# Patient Record
Sex: Female | Born: 1955 | Race: Black or African American | Hispanic: No | Smoking: Never smoker
Health system: Southern US, Community
[De-identification: ages and names within clinical notes are randomized; demographics above are authoritative.]

## PROBLEM LIST (undated history)

## (undated) DIAGNOSIS — IMO0002 Reserved for concepts with insufficient information to code with codable children: Secondary | ICD-10-CM

## (undated) DIAGNOSIS — I10 Essential (primary) hypertension: Secondary | ICD-10-CM

## (undated) DIAGNOSIS — E1165 Type 2 diabetes mellitus with hyperglycemia: Secondary | ICD-10-CM

## (undated) HISTORY — PX: OTHER SURGICAL HISTORY: SHX169

## (undated) HISTORY — PX: CHOLECYSTECTOMY: SHX55

## (undated) HISTORY — DX: Reserved for concepts with insufficient information to code with codable children: IMO0002

## (undated) HISTORY — DX: Essential (primary) hypertension: I10

## (undated) HISTORY — DX: Type 2 diabetes mellitus with hyperglycemia: E11.65

---

## 2013-10-27 HISTORY — PX: COLONOSCOPY: SHX174

## 2013-12-19 ENCOUNTER — Other Ambulatory Visit: Payer: Self-pay | Admitting: Internal Medicine

## 2013-12-19 DIAGNOSIS — Z0000 Encounter for general adult medical examination without abnormal findings: Secondary | ICD-10-CM

## 2014-05-01 ENCOUNTER — Ambulatory Visit (INDEPENDENT_AMBULATORY_CARE_PROVIDER_SITE_OTHER): Payer: BC Managed Care – PPO | Admitting: Podiatry

## 2014-05-01 ENCOUNTER — Encounter: Payer: Self-pay | Admitting: Podiatry

## 2014-05-01 VITALS — BP 142/89 | HR 92 | Ht 65.0 in | Wt 178.0 lb

## 2014-05-01 DIAGNOSIS — M79609 Pain in unspecified limb: Secondary | ICD-10-CM

## 2014-05-01 DIAGNOSIS — M216X9 Other acquired deformities of unspecified foot: Secondary | ICD-10-CM

## 2014-05-01 NOTE — Progress Notes (Signed)
Subjective: 58 year old female presents for Diabetic foot check. Blood sugar is under control, was in 90's this morning. No foot pain. Toe nails give her no problems. Does walk (7 miles/day) and lift weight. Walks 30 minutes every 2 hours. Having been doing this for the past 2 years. Gets her nails and hands done q 3 weeks.   Objective: Dermatologic: No abnormal findings other than thick nails. Vascular: All pedal pulses are palpable.  Neurologic: All epicritic and tactile sensations grossly intact.  Orthopedic: Tight Achilles tendon bilateral. Mild bunion bilateral.   Assessment: Diabetic under control. Ankle equinus bilateral.  Plan: Reviewed stretch exercise for tight Achilles tendon bilateral.

## 2014-05-01 NOTE — Patient Instructions (Signed)
Seen for diabetic foot check.  Noted of tight Achilles tendon bilateral. May benefit from stretch exercise.

## 2015-05-04 ENCOUNTER — Ambulatory Visit: Payer: BC Managed Care – PPO | Admitting: Podiatry

## 2015-05-23 ENCOUNTER — Ambulatory Visit (INDEPENDENT_AMBULATORY_CARE_PROVIDER_SITE_OTHER): Payer: Managed Care, Other (non HMO) | Admitting: Podiatry

## 2015-05-23 ENCOUNTER — Encounter: Payer: Self-pay | Admitting: Podiatry

## 2015-05-23 VITALS — BP 166/99 | HR 85

## 2015-05-23 DIAGNOSIS — M216X2 Other acquired deformities of left foot: Secondary | ICD-10-CM

## 2015-05-23 DIAGNOSIS — M216X1 Other acquired deformities of right foot: Secondary | ICD-10-CM

## 2015-05-23 NOTE — Patient Instructions (Signed)
Annual exam. Doing well with exercise. Continue with Achilles tendon stretch exercise.

## 2015-05-23 NOTE — Progress Notes (Signed)
Subjective: 59 year old female presents for Diabetic foot check.  Blood sugar is under control, was in 90's this morning. Continues with her daily walk (3 miles/day) and does eliptical.   Objective: Dermatologic: No abnormal findings other than thick nails. Vascular: All pedal pulses are palpable.  Neurologic: All epicritic and tactile sensations grossly intact.  Orthopedic: Tight Achilles tendon bilateral. Mild bunion bilateral.   Assessment: Diabetic under control. Ankle equinus bilateral.  Plan: Noted of no new change since last visit.  Reviewed stretch exercise for tight Achilles tendon bilateral.

## 2016-05-21 ENCOUNTER — Ambulatory Visit: Payer: Managed Care, Other (non HMO) | Admitting: Podiatry

## 2020-02-17 ENCOUNTER — Encounter: Payer: Self-pay | Admitting: Family Medicine

## 2020-02-17 ENCOUNTER — Ambulatory Visit (INDEPENDENT_AMBULATORY_CARE_PROVIDER_SITE_OTHER): Payer: PRIVATE HEALTH INSURANCE | Admitting: Family Medicine

## 2020-02-17 ENCOUNTER — Other Ambulatory Visit: Payer: Self-pay

## 2020-02-17 VITALS — BP 220/120 | HR 84 | Temp 97.8°F | Resp 18 | Ht 65.0 in | Wt 167.4 lb

## 2020-02-17 DIAGNOSIS — E1165 Type 2 diabetes mellitus with hyperglycemia: Secondary | ICD-10-CM | POA: Insufficient documentation

## 2020-02-17 DIAGNOSIS — I10 Essential (primary) hypertension: Secondary | ICD-10-CM | POA: Diagnosis not present

## 2020-02-17 LAB — TSH: TSH: 2.31 u[IU]/mL (ref 0.35–4.50)

## 2020-02-17 LAB — CBC WITH DIFFERENTIAL/PLATELET
Basophils Absolute: 0.1 10*3/uL (ref 0.0–0.1)
Basophils Relative: 0.7 % (ref 0.0–3.0)
Eosinophils Absolute: 0.2 10*3/uL (ref 0.0–0.7)
Eosinophils Relative: 2 % (ref 0.0–5.0)
HCT: 40.3 % (ref 36.0–46.0)
Hemoglobin: 13.6 g/dL (ref 12.0–15.0)
Lymphocytes Relative: 22 % (ref 12.0–46.0)
Lymphs Abs: 1.8 10*3/uL (ref 0.7–4.0)
MCHC: 33.7 g/dL (ref 30.0–36.0)
MCV: 95.4 fl (ref 78.0–100.0)
Monocytes Absolute: 0.4 10*3/uL (ref 0.1–1.0)
Monocytes Relative: 4.5 % (ref 3.0–12.0)
Neutro Abs: 5.7 10*3/uL (ref 1.4–7.7)
Neutrophils Relative %: 70.8 % (ref 43.0–77.0)
Platelets: 261 10*3/uL (ref 150.0–400.0)
RBC: 4.22 Mil/uL (ref 3.87–5.11)
RDW: 12.6 % (ref 11.5–15.5)
WBC: 8 10*3/uL (ref 4.0–10.5)

## 2020-02-17 LAB — COMPREHENSIVE METABOLIC PANEL
ALT: 21 U/L (ref 0–35)
AST: 18 U/L (ref 0–37)
Albumin: 4.4 g/dL (ref 3.5–5.2)
Alkaline Phosphatase: 104 U/L (ref 39–117)
BUN: 12 mg/dL (ref 6–23)
CO2: 29 mEq/L (ref 19–32)
Calcium: 10.6 mg/dL — ABNORMAL HIGH (ref 8.4–10.5)
Chloride: 97 mEq/L (ref 96–112)
Creatinine, Ser: 0.83 mg/dL (ref 0.40–1.20)
GFR: 83.95 mL/min (ref >60.00)
Glucose, Bld: 377 mg/dL — ABNORMAL HIGH (ref 70–99)
Potassium: 3.8 mEq/L (ref 3.5–5.1)
Sodium: 133 mEq/L — ABNORMAL LOW (ref 135–145)
Total Bilirubin: 0.9 mg/dL (ref 0.2–1.2)
Total Protein: 7.5 g/dL (ref 6.0–8.3)

## 2020-02-17 LAB — LIPID PANEL
Cholesterol: 199 mg/dL (ref 0–200)
HDL: 46.2 mg/dL (ref >39.00)
NonHDL: 152.74
Total CHOL/HDL Ratio: 4
Triglycerides: 212 mg/dL — ABNORMAL HIGH (ref 0.0–149.0)
VLDL: 42.4 mg/dL — ABNORMAL HIGH (ref 0.0–40.0)

## 2020-02-17 LAB — MICROALBUMIN / CREATININE URINE RATIO
Creatinine,U: 31.3 mg/dL
Microalb Creat Ratio: 66.1 mg/g — ABNORMAL HIGH (ref 0.0–30.0)
Microalb, Ur: 20.7 mg/dL — ABNORMAL HIGH (ref 0.0–1.9)

## 2020-02-17 LAB — HEMOGLOBIN A1C: Hgb A1c MFr Bld: 13.5 % — ABNORMAL HIGH (ref 4.6–6.5)

## 2020-02-17 LAB — LDL CHOLESTEROL, DIRECT: Direct LDL: 91 mg/dL

## 2020-02-17 MED ORDER — AMLODIPINE BESYLATE 10 MG PO TABS: 10.0000 mg | ORAL_TABLET | Freq: Every day | ORAL | 1 refills | Status: DC

## 2020-02-17 MED ORDER — LOSARTAN POTASSIUM-HCTZ 100-25 MG PO TABS: 1.0000 | ORAL_TABLET | Freq: Every day | ORAL | 1 refills | Status: DC

## 2020-02-17 NOTE — Assessment & Plan Note (Signed)
Check labs  con't meds  Refer to nutrition

## 2020-02-17 NOTE — Patient Instructions (Addendum)
Type 2 Diabetes Mellitus, Diagnosis, Adult Type 2 diabetes (type 2 diabetes mellitus) is a long-term (chronic) disease. It may be caused by one or both of these problems:  Your pancreas does not make enough of a hormone called insulin.  Your body does not react in a normal way to insulin that it makes. Insulin lets sugars (glucose) go into cells in your body. This gives you energy. If you have type 2 diabetes, sugars cannot get into cells. This causes high blood sugar (hyperglycemia). Your doctor will set treatment goals for you. Generally, you should have these blood sugar levels:  Before meals (preprandial): 80-130 mg/dL (4.4-7.2 mmol/L).  After meals (postprandial): below 180 mg/dL (10 mmol/L).  A1c (hemoglobin A1c) level: less than 7%. Follow these instructions at home: Questions to ask your doctor  You may want to ask these questions: ? Do I need to meet with a diabetes educator? ? Where can I find a support group for people with diabetes? ? What equipment will I need to care for myself at home? ? What diabetes medicines do I need? When should I take them? ? How often do I need to check my blood sugar? ? What number can I call if I have questions? ? When is my next doctor's visit? General instructions  Take over-the-counter and prescription medicines only as told by your doctor.  Keep all follow-up visits as told by your doctor. This is important. Contact a doctor if:  Your blood sugar is at or above 240 mg/dL (13.3 mmol/L) for 2 days in a row.  You have been sick for 2 days or more, and you are not getting better.  You have had a fever for 2 days or more, and you are not getting better.  You have any of these problems for more than 6 hours: ? You cannot eat or drink. ? You feel sick to your stomach (nauseous). ? You throw up (vomit). ? You have watery poop (diarrhea). Get help right away if:  Your blood sugar is lower than 54 mg/dL (3 mmol/L).  You get  confused.  You have trouble: ? Thinking clearly. ? Breathing.  You have moderate or large ketone levels in your pee (urine). Summary  Type 2 diabetes is a long-term (chronic) disease. Your pancreas may not make enough of a hormone called insulin, or your body may not react normally to insulin that it makes.  Take over-the-counter and prescription medicines only as told by your doctor.  Keep all follow-up visits as told by your doctor. This is important. This information is not intended to replace advice given to you by your health care provider. Make sure you discuss any questions you have with your health care provider. Document Revised: 11/13/2017 Document Reviewed: 10/19/2015 Elsevier Patient Education  2020 Elsevier Inc.  

## 2020-02-17 NOTE — Progress Notes (Signed)
Patient ID: Jordan Joseph, female    DOB: February 28, 1956  Age: 64 y.o. MRN: [id number]    Subjective:  Subjective  HPI Jordan Joseph presents to establish and f/u dm, bp    Pt has been out of her bp meds for almost 2 weeks and has complained of blurry vision since  No cp, no headache, no palpitations   Review of Systems  Constitutional: Negative for appetite change, diaphoresis, fatigue and unexpected weight change.  Eyes: Positive for visual disturbance. Negative for pain and redness.  Respiratory: Negative for cough, chest tightness, shortness of breath and wheezing.   Cardiovascular: Negative for chest pain, palpitations and leg swelling.  Endocrine: Negative for cold intolerance, heat intolerance, polydipsia, polyphagia and polyuria.  Genitourinary: Negative for difficulty urinating, dysuria and frequency.  Neurological: Negative for dizziness, light-headedness, numbness and headaches.    History Past Medical History:  Diagnosis Date  . Diabetes mellitus type II, uncontrolled (HCC)   . HTN (hypertension)     She has no past surgical history on file.   Her family history includes Alzheimer's disease in her mother; Cirrhosis in her father; Diabetes in her brother; Diabetes Mellitus II in her father; Hypertension in her brother and father.She reports that she has never smoked. She has never used smokeless tobacco. No history on file for alcohol and drug.  Current Outpatient Medications on File Prior to Visit  Medication Sig Dispense Refill  . glipiZIDE-metformin (METAGLIP) 5-500 MG per tablet Take 1 tablet by mouth 2 (two) times daily before a meal.      No current facility-administered medications on file prior to visit.     Objective:  Objective  Physical Exam Vitals and nursing note reviewed.  Constitutional:      Appearance: She is well-developed.  HENT:     Head: Normocephalic and atraumatic.  Eyes:     Conjunctiva/sclera: Conjunctivae normal.  Neck:     Thyroid: No  thyromegaly.     Vascular: No carotid bruit or JVD.  Cardiovascular:     Rate and Rhythm: Normal rate and regular rhythm.     Heart sounds: Normal heart sounds. No murmur.  Pulmonary:     Effort: Pulmonary effort is normal. No respiratory distress.     Breath sounds: Normal breath sounds. No wheezing or rales.  Chest:     Chest wall: No tenderness.  Musculoskeletal:     Cervical back: Normal range of motion and neck supple.  Neurological:     Mental Status: She is alert and oriented to person, place, and time.    BP (!) 220/120 (BP Location: Right Arm, Patient Position: Sitting, Cuff Size: Large)   Pulse 84   Temp 97.8 F (36.6 C) (Temporal)   Resp 18   Ht 5\' 5"  (1.651 m)   Wt 167 lb 6.4 oz (75.9 kg)   LMP  (LMP Unknown)   SpO2 97%   BMI 27.86 kg/m  Wt Readings from Last 3 Encounters:  02/17/20 167 lb 6.4 oz (75.9 kg)  05/01/14 178 lb (80.7 kg)     Lab Results  Component Value Date   WBC 8.0 02/17/2020   HGB 13.6 02/17/2020   HCT 40.3 02/17/2020   PLT 261.0 02/17/2020   GLUCOSE 377 (H) 02/17/2020   CHOL 199 02/17/2020   TRIG 212.0 (H) 02/17/2020   HDL 46.20 02/17/2020   LDLDIRECT 91.0 02/17/2020   ALT 21 02/17/2020   AST 18 02/17/2020   NA 133 (L) 02/17/2020   K 3.8 02/17/2020  CL 97 02/17/2020   CREATININE 0.83 02/17/2020   BUN 12 02/17/2020   CO2 29 02/17/2020   TSH 2.31 02/17/2020   HGBA1C 13.5 Repeated and verified X2. (H) 02/17/2020   MICROALBUR 20.7 (H) 02/17/2020    Patient was never admitted.   Assessment & Plan:  Plan  I have changed [name]'s amLODipine and losartan-hydrochlorothiazide. I am also having her maintain her glipiZIDE-metformin.  Meds ordered this encounter  Medications  . amLODipine (NORVASC) 10 MG tablet    Sig: Take 1 tablet (10 mg total) by mouth daily.    Dispense:  90 tablet    Refill:  1  . losartan-hydrochlorothiazide (HYZAAR) 100-25 MG tablet    Sig: Take 1 tablet by mouth daily.    Dispense:  90 tablet     Refill:  1    Problem List Items Addressed This Visit      Unprioritized   Hypertension - Primary    Poorly controlled will alter medications, encouraged DASH diet, minimize caffeine and obtain adequate sleep. Report concerning symptoms and follow up as directed and as needed---- pt ran out of meds D/w pt importance of not running out of medication  ekg-- sinus , L atrial enlargment Echo ordered F/u 2 weeks       Relevant Medications   amLODipine (NORVASC) 10 MG tablet   losartan-hydrochlorothiazide (HYZAAR) 100-25 MG tablet   Other Relevant Orders   EKG 12-Lead (Completed)   CBC with Differential/Platelet (Completed)   Lipid panel (Completed)   TSH (Completed)   Comprehensive metabolic panel (Completed)   Hemoglobin A1c (Completed)   Microalbumin / creatinine urine ratio (Completed)   POCT Urinalysis Dipstick (Automated)   ECHOCARDIOGRAM COMPLETE   Uncontrolled type 2 diabetes mellitus with hyperglycemia (HCC)    Check labs  con't meds  Refer to nutrition       Relevant Medications   losartan-hydrochlorothiazide (HYZAAR) 100-25 MG tablet   Other Relevant Orders   CBC with Differential/Platelet (Completed)   Lipid panel (Completed)   TSH (Completed)   Comprehensive metabolic panel (Completed)   Hemoglobin A1c (Completed)   Microalbumin / creatinine urine ratio (Completed)   POCT Urinalysis Dipstick (Automated)   Ambulatory referral to diabetic education      Follow-up: Return in about 2 weeks (around 03/02/2020), or if symptoms worsen or fail to improve, for hypertension.  Ann Held, DO

## 2020-02-17 NOTE — Assessment & Plan Note (Signed)
Poorly controlled will alter medications, encouraged DASH diet, minimize caffeine and obtain adequate sleep. Report concerning symptoms and follow up as directed and as needed---- pt ran out of meds D/w pt importance of not running out of medication  ekg-- sinus , L atrial enlargment Echo ordered F/u 2 weeks

## 2020-02-22 ENCOUNTER — Other Ambulatory Visit: Payer: Self-pay | Admitting: Family Medicine

## 2020-02-22 DIAGNOSIS — E785 Hyperlipidemia, unspecified: Secondary | ICD-10-CM

## 2020-02-22 DIAGNOSIS — E1165 Type 2 diabetes mellitus with hyperglycemia: Secondary | ICD-10-CM

## 2020-02-24 ENCOUNTER — Other Ambulatory Visit: Payer: Self-pay

## 2020-02-24 DIAGNOSIS — E1165 Type 2 diabetes mellitus with hyperglycemia: Secondary | ICD-10-CM

## 2020-02-24 MED ORDER — TRESIBA FLEXTOUCH 100 UNIT/ML ~~LOC~~ SOPN: 10.0000 [IU] | PEN_INJECTOR | Freq: Every day | SUBCUTANEOUS | 1 refills | Status: DC

## 2020-02-24 MED ORDER — SIMVASTATIN 20 MG PO TABS
20.0000 mg | ORAL_TABLET | Freq: Every day | ORAL | 0 refills | Status: DC
Start: 2020-02-24 — End: 2020-04-10

## 2020-02-24 NOTE — Addendum Note (Signed)
Addended by: Steve Rattler A on: 02/24/2020 11:18 AM   Modules accepted: Orders

## 2020-02-24 NOTE — Addendum Note (Signed)
Addended by: Steve Rattler A on: 02/24/2020 11:25 AM   Modules accepted: Orders

## 2020-03-02 ENCOUNTER — Encounter: Payer: Self-pay | Admitting: Family Medicine

## 2020-03-02 ENCOUNTER — Other Ambulatory Visit: Payer: Self-pay

## 2020-03-02 ENCOUNTER — Ambulatory Visit: Payer: PRIVATE HEALTH INSURANCE | Admitting: Family Medicine

## 2020-03-02 VITALS — BP 160/80 | HR 98 | Temp 97.8°F | Resp 18 | Ht 65.0 in | Wt 173.2 lb

## 2020-03-02 DIAGNOSIS — E1165 Type 2 diabetes mellitus with hyperglycemia: Secondary | ICD-10-CM

## 2020-03-02 DIAGNOSIS — I10 Essential (primary) hypertension: Secondary | ICD-10-CM

## 2020-03-02 MED ORDER — CARVEDILOL 6.25 MG PO TABS: 6.2500 mg | ORAL_TABLET | Freq: Two times a day (BID) | ORAL | 3 refills | Status: DC

## 2020-03-02 MED ORDER — TRESIBA FLEXTOUCH 100 UNIT/ML ~~LOC~~ SOPN: 20.0000 [IU] | PEN_INJECTOR | Freq: Every day | SUBCUTANEOUS | 1 refills | Status: DC

## 2020-03-02 NOTE — Progress Notes (Signed)
Patient ID: Jordan Joseph, female    DOB: 08/22/1956  Age: 64 y.o. MRN: [id number]    Subjective:  Subjective  HPI Jordan Joseph presents for f/u dm and bp.  Both have been running high  Review of Systems  Constitutional: Negative for appetite change, diaphoresis, fatigue and unexpected weight change.  Eyes: Negative for pain, redness and visual disturbance.  Respiratory: Negative for cough, chest tightness, shortness of breath and wheezing.   Cardiovascular: Negative for chest pain, palpitations and leg swelling.  Endocrine: Negative for cold intolerance, heat intolerance, polydipsia, polyphagia and polyuria.  Genitourinary: Negative for difficulty urinating, dysuria and frequency.  Neurological: Negative for dizziness, light-headedness, numbness and headaches.    History Past Medical History:  Diagnosis Date  . Diabetes mellitus type II, uncontrolled (HCC)   . HTN (hypertension)     She has no past surgical history on file.   Her family history includes Alzheimer's disease in her mother; Cirrhosis in her father; Diabetes in her brother; Diabetes Mellitus II in her father; Hypertension in her brother and father.She reports that she has never smoked. She has never used smokeless tobacco. No history on file for alcohol and drug.  Current Outpatient Medications on File Prior to Visit  Medication Sig Dispense Refill  . amLODipine (NORVASC) 10 MG tablet Take 1 tablet (10 mg total) by mouth daily. 90 tablet 1  . losartan-hydrochlorothiazide (HYZAAR) 100-25 MG tablet Take 1 tablet by mouth daily. 90 tablet 1  . simvastatin (ZOCOR) 20 MG tablet Take 1 tablet (20 mg total) by mouth at bedtime. 30 tablet 0  . glipiZIDE-metformin (METAGLIP) 5-500 MG per tablet Take 1 tablet by mouth 2 (two) times daily before a meal.      No current facility-administered medications on file prior to visit.     Objective:  Objective  Physical Exam Vitals and nursing note reviewed.  Constitutional:    Appearance: She is well-developed.  HENT:     Head: Normocephalic and atraumatic.  Eyes:     Conjunctiva/sclera: Conjunctivae normal.  Neck:     Thyroid: No thyromegaly.     Vascular: No carotid bruit or JVD.  Cardiovascular:     Rate and Rhythm: Normal rate and regular rhythm.     Heart sounds: Normal heart sounds. No murmur.  Pulmonary:     Effort: Pulmonary effort is normal. No respiratory distress.     Breath sounds: Normal breath sounds. No wheezing or rales.  Chest:     Chest wall: No tenderness.  Musculoskeletal:     Cervical back: Normal range of motion and neck supple.  Neurological:     Mental Status: She is alert and oriented to person, place, and time.    BP (!) 160/80 (BP Location: Right Arm, Patient Position: Sitting, Cuff Size: Normal)   Pulse 98   Temp 97.8 F (36.6 C) (Temporal)   Resp 18   Ht 5\' 5"  (1.651 m)   Wt 173 lb 3.2 oz (78.6 kg)   LMP  (LMP Unknown)   SpO2 99%   BMI 28.82 kg/m  Wt Readings from Last 3 Encounters:  03/02/20 173 lb 3.2 oz (78.6 kg)  02/17/20 167 lb 6.4 oz (75.9 kg)  05/01/14 178 lb (80.7 kg)     Lab Results  Component Value Date   WBC 8.0 02/17/2020   HGB 13.6 02/17/2020   HCT 40.3 02/17/2020   PLT 261.0 02/17/2020   GLUCOSE 377 (H) 02/17/2020   CHOL 199 02/17/2020   TRIG 212.0 (H)  02/17/2020   HDL 46.20 02/17/2020   LDLDIRECT 91.0 02/17/2020   ALT 21 02/17/2020   AST 18 02/17/2020   NA 133 (L) 02/17/2020   K 3.8 02/17/2020   CL 97 02/17/2020   CREATININE 0.83 02/17/2020   BUN 12 02/17/2020   CO2 29 02/17/2020   TSH 2.31 02/17/2020   HGBA1C 13.5 Repeated and verified X2. (H) 02/17/2020   MICROALBUR 20.7 (H) 02/17/2020    Patient was never admitted.   Assessment & Plan:  Plan  I have changed Malli Fincher's Tyler Aas FlexTouch. I am also having her start on carvedilol. Additionally, I am having her maintain her glipiZIDE-metformin, amLODipine, losartan-hydrochlorothiazide, and simvastatin.  Meds ordered this  encounter  Medications  . insulin degludec (TRESIBA FLEXTOUCH) 100 UNIT/ML FlexTouch Pen    Sig: Inject 0.2 mLs (20 Units total) into the skin daily.    Dispense:  3 mL    Refill:  1  . carvedilol (COREG) 6.25 MG tablet    Sig: Take 1 tablet (6.25 mg total) by mouth 2 (two) times daily with a meal.    Dispense:  60 tablet    Refill:  3    Problem List Items Addressed This Visit      Unprioritized   Hypertension    Poorly controlled will alter medications, encouraged DASH diet, minimize caffeine and obtain adequate sleep. Report concerning symptoms and follow up as directed and as needed      Relevant Medications   carvedilol (COREG) 6.25 MG tablet   Uncontrolled type 2 diabetes mellitus with hyperglycemia (HCC) - Primary    Increase tresida to 20 u daily F/u endo       Relevant Medications   insulin degludec (TRESIBA FLEXTOUCH) 100 UNIT/ML FlexTouch Pen      Follow-up: Return in about 3 weeks (around 03/23/2020), or if symptoms worsen or fail to improve.  Ann Held, DO

## 2020-03-02 NOTE — Assessment & Plan Note (Signed)
Poorly controlled will alter medications, encouraged DASH diet, minimize caffeine and obtain adequate sleep. Report concerning symptoms and follow up as directed and as needed 

## 2020-03-02 NOTE — Assessment & Plan Note (Signed)
Increase tresida to 20 u daily F/u endo

## 2020-03-02 NOTE — Patient Instructions (Signed)
DASH Eating Plan DASH stands for "Dietary Approaches to Stop Hypertension." The DASH eating plan is a healthy eating plan that has been shown to reduce high blood pressure (hypertension). It may also reduce your risk for type 2 diabetes, heart disease, and stroke. The DASH eating plan may also help with weight loss. What are tips for following this plan?  General guidelines  Avoid eating more than 2,300 mg (milligrams) of salt (sodium) a day. If you have hypertension, you may need to reduce your sodium intake to 1,500 mg a day.  Limit alcohol intake to no more than 1 drink a day for nonpregnant women and 2 drinks a day for men. One drink equals 12 oz of beer, 5 oz of wine, or 1 oz of hard liquor.  Work with your health care provider to maintain a healthy body weight or to lose weight. Ask what an ideal weight is for you.  Get at least 30 minutes of exercise that causes your heart to beat faster (aerobic exercise) most days of the week. Activities may include walking, swimming, or biking.  Work with your health care provider or diet and nutrition specialist (dietitian) to adjust your eating plan to your individual calorie needs. Reading food labels   Check food labels for the amount of sodium per serving. Choose foods with less than 5 percent of the Daily Value of sodium. Generally, foods with less than 300 mg of sodium per serving fit into this eating plan.  To find whole grains, look for the word "whole" as the first word in the ingredient list. Shopping  Buy products labeled as "low-sodium" or "no salt added."  Buy fresh foods. Avoid canned foods and premade or frozen meals. Cooking  Avoid adding salt when cooking. Use salt-free seasonings or herbs instead of table salt or sea salt. Check with your health care provider or pharmacist before using salt substitutes.  Do not fry foods. Cook foods using healthy methods such as baking, boiling, grilling, and broiling instead.  Cook with  heart-healthy oils, such as olive, canola, soybean, or sunflower oil. Meal planning  Eat a balanced diet that includes: ? 5 or more servings of fruits and vegetables each day. At each meal, try to fill half of your plate with fruits and vegetables. ? Up to 6-8 servings of whole grains each day. ? Less than 6 oz of lean meat, poultry, or fish each day. A 3-oz serving of meat is about the same size as a deck of cards. One egg equals 1 oz. ? 2 servings of low-fat dairy each day. ? A serving of nuts, seeds, or beans 5 times each week. ? Heart-healthy fats. Healthy fats called Omega-3 fatty acids are found in foods such as flaxseeds and coldwater fish, like sardines, salmon, and mackerel.  Limit how much you eat of the following: ? Canned or prepackaged foods. ? Food that is high in trans fat, such as fried foods. ? Food that is high in saturated fat, such as fatty meat. ? Sweets, desserts, sugary drinks, and other foods with added sugar. ? Full-fat dairy products.  Do not salt foods before eating.  Try to eat at least 2 vegetarian meals each week.  Eat more home-cooked food and less restaurant, buffet, and fast food.  When eating at a restaurant, ask that your food be prepared with less salt or no salt, if possible. What foods are recommended? The items listed may not be a complete list. Talk with your dietitian about   what dietary choices are best for you. Grains Whole-grain or whole-wheat bread. Whole-grain or whole-wheat pasta. Brown rice. Oatmeal. Quinoa. Bulgur. Whole-grain and low-sodium cereals. Pita bread. Low-fat, low-sodium crackers. Whole-wheat flour tortillas. Vegetables Fresh or frozen vegetables (raw, steamed, roasted, or grilled). Low-sodium or reduced-sodium tomato and vegetable juice. Low-sodium or reduced-sodium tomato sauce and tomato paste. Low-sodium or reduced-sodium canned vegetables. Fruits All fresh, dried, or frozen fruit. Canned fruit in natural juice (without  added sugar). Meat and other protein foods Skinless chicken or turkey. Ground chicken or turkey. Pork with fat trimmed off. Fish and seafood. Egg whites. Dried beans, peas, or lentils. Unsalted nuts, nut butters, and seeds. Unsalted canned beans. Lean cuts of beef with fat trimmed off. Low-sodium, lean deli meat. Dairy Low-fat (1%) or fat-free (skim) milk. Fat-free, low-fat, or reduced-fat cheeses. Nonfat, low-sodium ricotta or cottage cheese. Low-fat or nonfat yogurt. Low-fat, low-sodium cheese. Fats and oils Soft margarine without trans fats. Vegetable oil. Low-fat, reduced-fat, or light mayonnaise and salad dressings (reduced-sodium). Canola, safflower, olive, soybean, and sunflower oils. Avocado. Seasoning and other foods Herbs. Spices. Seasoning mixes without salt. Unsalted popcorn and pretzels. Fat-free sweets. What foods are not recommended? The items listed may not be a complete list. Talk with your dietitian about what dietary choices are best for you. Grains Baked goods made with fat, such as croissants, muffins, or some breads. Dry pasta or rice meal packs. Vegetables Creamed or fried vegetables. Vegetables in a cheese sauce. Regular canned vegetables (not low-sodium or reduced-sodium). Regular canned tomato sauce and paste (not low-sodium or reduced-sodium). Regular tomato and vegetable juice (not low-sodium or reduced-sodium). Pickles. Olives. Fruits Canned fruit in a light or heavy syrup. Fried fruit. Fruit in cream or butter sauce. Meat and other protein foods Fatty cuts of meat. Ribs. Fried meat. Bacon. Sausage. Bologna and other processed lunch meats. Salami. Fatback. Hotdogs. Bratwurst. Salted nuts and seeds. Canned beans with added salt. Canned or smoked fish. Whole eggs or egg yolks. Chicken or turkey with skin. Dairy Whole or 2% milk, cream, and half-and-half. Whole or full-fat cream cheese. Whole-fat or sweetened yogurt. Full-fat cheese. Nondairy creamers. Whipped toppings.  Processed cheese and cheese spreads. Fats and oils Butter. Stick margarine. Lard. Shortening. Ghee. Bacon fat. Tropical oils, such as coconut, palm kernel, or palm oil. Seasoning and other foods Salted popcorn and pretzels. Onion salt, garlic salt, seasoned salt, table salt, and sea salt. Worcestershire sauce. Tartar sauce. Barbecue sauce. Teriyaki sauce. Soy sauce, including reduced-sodium. Steak sauce. Canned and packaged gravies. Fish sauce. Oyster sauce. Cocktail sauce. Horseradish that you find on the shelf. Ketchup. Mustard. Meat flavorings and tenderizers. Bouillon cubes. Hot sauce and Tabasco sauce. Premade or packaged marinades. Premade or packaged taco seasonings. Relishes. Regular salad dressings. Where to find more information:  National Heart, Lung, and Blood Institute: www.nhlbi.nih.gov  American Heart Association: www.heart.org Summary  The DASH eating plan is a healthy eating plan that has been shown to reduce high blood pressure (hypertension). It may also reduce your risk for type 2 diabetes, heart disease, and stroke.  With the DASH eating plan, you should limit salt (sodium) intake to 2,300 mg a day. If you have hypertension, you may need to reduce your sodium intake to 1,500 mg a day.  When on the DASH eating plan, aim to eat more fresh fruits and vegetables, whole grains, lean proteins, low-fat dairy, and heart-healthy fats.  Work with your health care provider or diet and nutrition specialist (dietitian) to adjust your eating plan to your   individual calorie needs. This information is not intended to replace advice given to you by your health care provider. Make sure you discuss any questions you have with your health care provider. Document Revised: 08/28/2017 Document Reviewed: 09/08/2016 Elsevier Patient Education  2020 [employer] Inc.  

## 2020-03-06 ENCOUNTER — Ambulatory Visit: Payer: PRIVATE HEALTH INSURANCE | Admitting: Internal Medicine

## 2020-03-06 ENCOUNTER — Other Ambulatory Visit: Payer: Self-pay

## 2020-03-06 ENCOUNTER — Other Ambulatory Visit (HOSPITAL_BASED_OUTPATIENT_CLINIC_OR_DEPARTMENT_OTHER): Payer: Self-pay | Admitting: Family Medicine

## 2020-03-06 ENCOUNTER — Encounter: Payer: Self-pay | Admitting: Internal Medicine

## 2020-03-06 VITALS — BP 148/92 | HR 87 | Temp 98.9°F | Ht 65.0 in | Wt 173.2 lb

## 2020-03-06 DIAGNOSIS — E785 Hyperlipidemia, unspecified: Secondary | ICD-10-CM

## 2020-03-06 DIAGNOSIS — E1165 Type 2 diabetes mellitus with hyperglycemia: Secondary | ICD-10-CM

## 2020-03-06 DIAGNOSIS — E1169 Type 2 diabetes mellitus with other specified complication: Secondary | ICD-10-CM

## 2020-03-06 DIAGNOSIS — I10 Essential (primary) hypertension: Secondary | ICD-10-CM

## 2020-03-06 MED ORDER — METFORMIN HCL ER 500 MG PO TB24: 500.0000 mg | ORAL_TABLET | Freq: Every day | ORAL | 3 refills | Status: DC

## 2020-03-06 MED ORDER — TRESIBA FLEXTOUCH 100 UNIT/ML ~~LOC~~ SOPN: 22.0000 [IU] | PEN_INJECTOR | Freq: Every day | SUBCUTANEOUS | 6 refills | Status: DC

## 2020-03-06 NOTE — Progress Notes (Signed)
Name: Jordan Joseph  MRN/ DOB: [id number], [birth year]   Age/ Sex: 64 y.o., female    PCP: Zola Button, Grayling Congress, DO   Reason for Endocrinology Evaluation: Type 2 Diabetes Mellitus     Date of Initial Endocrinology Visit: 03/06/2020     PATIENT IDENTIFIER: [name] is a 64 y.o. female with a past medical history of HTN and T2DM . The patient presented for initial endocrinology clinic visit on 03/06/2020 for consultative assistance with her diabetes management.    HPI: [name] was    Diagnosed with DM 2013 Prior Medications tried/Intolerance: Insulin started 01/2020, Metformin-Glipizide - vomiting  Currently checking blood sugars 2 x / day Hypoglycemia episodes : no              Hemoglobin A1c was 13.5% on presentation  Patient required assistance for hypoglycemia: no  Patient has required hospitalization within the last 1 year from hyper or hypoglycemia:   In terms of diet, the patient eats 3 meals a day, avoids sugar- sweetened beverages.    HOME DIABETES REGIMEN: Gllipizide- Metformin 5-500 , BID- not taking   Tresiba 20 unts daily     Statin: Yes ACE-I/ARB:Yes Prior Diabetic Education: has pending appointment   METER DOWNLOAD SUMMARY:  300- 570 mg/dL    DIABETIC COMPLICATIONS: Microvascular complications:    Denies: CKD, retinopathy, neuropathy   Last eye exam: Completed 2020  Macrovascular complications:    Denies: CAD, PVD, CVA   PAST HISTORY: Past Medical History:  Past Medical History:  Diagnosis Date   Diabetes mellitus type II, uncontrolled (HCC)    HTN (hypertension)     Past Surgical History:    Social History:  reports that she has never smoked. She has never used smokeless tobacco. No history on file for alcohol and drug. Family History:  Family History  Problem Relation Age of Onset   Alzheimer's disease Mother    Hypertension Father    Cirrhosis Father    Diabetes Mellitus II Father    Diabetes Brother     Hypertension Brother      HOME MEDICATIONS: Allergies as of 03/06/2020   No Known Allergies     Medication List       Accurate as of March 06, 2020  3:01 PM. If you have any questions, ask your nurse or doctor.        amLODipine 10 MG tablet Commonly known as: NORVASC Take 1 tablet (10 mg total) by mouth daily.   carvedilol 6.25 MG tablet Commonly known as: COREG Take 1 tablet (6.25 mg total) by mouth 2 (two) times daily with a meal.   glipiZIDE-metformin 5-500 MG tablet Commonly known as: METAGLIP Take 1 tablet by mouth 2 (two) times daily before a meal.   losartan-hydrochlorothiazide 100-25 MG tablet Commonly known as: HYZAAR Take 1 tablet by mouth daily.   simvastatin 20 MG tablet Commonly known as: ZOCOR Take 1 tablet (20 mg total) by mouth at bedtime.   [name] FlexTouch 100 UNIT/ML FlexTouch Pen Generic drug: insulin degludec Inject 0.2 mLs (20 Units total) into the skin daily.   UNABLE TO FIND Med Name: derio blood sugar meter        ALLERGIES: No Known Allergies   REVIEW OF SYSTEMS: A comprehensive ROS was conducted with the patient and is negative except as per HPI and below:     OBJECTIVE:   VITAL SIGNS: BP (!) 148/92 (BP Location: Left Arm, Patient Position: Sitting, Cuff Size:  Normal)    Pulse 87    Temp 98.9 F (37.2 C)    Ht 5\' 5"  (1.651 m)    Wt 173 lb 3.2 oz (78.6 kg)    LMP  (LMP Unknown)    SpO2 99%    BMI 28.82 kg/m    PHYSICAL EXAM:  General: Pt appears well and is in NAD  HEENT:  Eyes: External eye exam normal without stare, lid lag or exophthalmos.  EOM intact.    Neck: General: Supple without adenopathy or carotid bruits. Thyroid: Thyroid size normal.  No goiter or nodules appreciated. No thyroid bruit.  Lungs: Clear with good BS bilat with no rales, rhonchi, or wheezes  Heart: RRR with normal S1 and S2 and no gallops; no murmurs; no rub  Abdomen: Normoactive bowel sounds, soft, nontender, without masses or organomegaly palpable    Extremities:  Lower extremities - No pretibial edema. No lesions.  Skin: Normal texture and temperature to palpation  Neuro: MS is good with appropriate affect, pt is alert and Ox3     DATA REVIEWED:  Lab Results  Component Value Date   HGBA1C 13.5 Repeated and verified X2. (H) 02/17/2020   Lab Results  Component Value Date   MICROALBUR 20.7 (H) 02/17/2020   CREATININE 0.83 02/17/2020   Lab Results  Component Value Date   MICRALBCREAT 66.1 (H) 02/17/2020    Lab Results  Component Value Date   CHOL 199 02/17/2020   HDL 46.20 02/17/2020   LDLDIRECT 91.0 02/17/2020   TRIG 212.0 (H) 02/17/2020   CHOLHDL 4 02/17/2020        ASSESSMENT / PLAN / RECOMMENDATIONS:   1) Type 2 Diabetes Mellitus, Poorly controlled, Without complications - Most recent A1c of 13.5%. Goal A1c < 7.0 %.    Plan: GENERAL: I have discussed with the patient the pathophysiology of diabetes. We went over the natural progression of the disease. We talked about both insulin resistance and insulin deficiency. We stressed the importance of lifestyle changes including diet and exercise. I explained the complications associated with diabetes including retinopathy, nephropathy, neuropathy as well as increased risk of cardiovascular disease. We went over the benefit seen with glycemic control.    I explained to the patient that diabetic patients are at higher than normal risk for amputations.   I have recommended adding a prandial insulin with an A1c > 10.0% , but the pt is concerned about the cost of insulin as she had to pay $500 for tresiba. After further discussion we have opted for starting metformin and adjusting tresiba dose.   MEDICATIONS: - Start Metformin 500 mg XR, 1 tablet daily with Breakfast  - Increase Tresiba to 22 units daily    EDUCATION / INSTRUCTIONS:  BG monitoring instructions: Patient is instructed to check her blood sugars 2 times a day, fasting and bedtime .  Call [facility] clinic if: BG persistently < 70  I reviewed the Rule of 15 for the treatment of hypoglycemia in detail with the patient. Literature supplied.   2) Diabetic complications:   Eye: Does not have known diabetic retinopathy.   Neuro/ Feet: Does not have known diabetic peripheral neuropathy.  Renal: Patient does not have known baseline CKD. She is  on an ACEI/ARB at present.  3) Dyslipidemia: Patient is on Simvastatin, LDL acceptable. Discussed cardiovascular benefits.      F/U 3 months      Signed electronically by: 02/19/2020, MD  [facility] Endocrinology  [facility]  [facility] [address], [place], [place] [address] Phone: [phone] FAX: [phone]   CC: Ann Held, DO [address] [place] [address] Phone: [phone]  Fax: [phone]    Return to Endocrinology clinic as below: Future Appointments  Date Time Provider [place]  03/21/2020 11:00 AM Ardelle Lesches, [place] [facility]  03/23/2020 11:15 AM [facility] 1 [facility]  03/27/2020  1:00 PM Lowne Koren Shiver, DO [facility]  05/29/2020  8:15 AM [facility]

## 2020-03-06 NOTE — Patient Instructions (Addendum)
-   Start Metformin 500 mg XR, 1 tablet daily with Breakfast  - Increase Tresiba to 22 units daily      HOW TO TREAT LOW BLOOD SUGARS (Blood sugar LESS THAN 70 MG/DL)  Please follow the RULE OF 15 for the treatment of hypoglycemia treatment (when your (blood sugars are less than 70 mg/dL)    STEP 1: Take 15 grams of carbohydrates when your blood sugar is low, which includes:   3-4 GLUCOSE TABS  OR  3-4 OZ OF JUICE OR REGULAR SODA OR  ONE TUBE OF GLUCOSE GEL     STEP 2: RECHECK blood sugar in 15 MINUTES STEP 3: If your blood sugar is still low at the 15 minute recheck --> then, go back to STEP 1 and treat AGAIN with another 15 grams of carbohydrates.

## 2020-03-21 ENCOUNTER — Other Ambulatory Visit: Payer: Self-pay

## 2020-03-21 ENCOUNTER — Encounter: Payer: Self-pay | Admitting: Registered"

## 2020-03-21 ENCOUNTER — Encounter: Payer: PRIVATE HEALTH INSURANCE | Attending: Family Medicine | Admitting: Registered"

## 2020-03-21 DIAGNOSIS — E119 Type 2 diabetes mellitus without complications: Secondary | ICD-10-CM | POA: Insufficient documentation

## 2020-03-21 NOTE — Progress Notes (Signed)
Diabetes Self-Management Education  Visit Type:  First/Initial  Appt. Start Time: 11:10 Appt. End Time: 12:14  03/21/2020  [name], identified by name and date of birth, is a 64 y.o. female with a diagnosis of Diabetes: Type 2.   ASSESSMENT  States diabetes and hypertension have been erratic lately and currently having a harder time managing. States prior to 10/2019 things were well managed but since things have gotten out of control.  Reports previous fear of being on insulin due to fear of needles. States she is still fearful of needles but doing what needs to be done.   Checks BS 2-3x/day: FBS (199-high 200s) and after eating (300-400). States numbers are starting to to decrease with added medications.  States she tries not to eat out. Cooks most of the time. Cooks 2 meats + 4-5 vegetables weekly and rotates throughout the week. States she eats dinner of 6 pm and goes to bed around 11 pm. Feels hungry around 9 pm and will have evening snack.   Next PCP appt is 8/23. Next eye appt is in 2 weeks.   Retired and works as a Engineer, production.  There were no vitals taken for this visit. There is no height or weight on file to calculate BMI.    Diabetes Self-Management Education - 03/21/20 1115      Health Coping   How would you rate your overall health? Fair      Psychosocial Assessment   Patient Belief/Attitude about Diabetes Motivated to manage diabetes    Self-care barriers None    Self-management support Friends;Doctor's office    Patient Concerns Nutrition/Meal planning    Special Needs None    Preferred Learning Style No preference indicated    Learning Readiness Change in progress      Complications   Last HgB A1C per patient/outside source 13.5 %    How often do you check your blood sugar? 3-4 times/day    Fasting Blood glucose range (mg/dL) 093-235;>573    Postprandial Blood glucose range (mg/dL) >220    Number of hypoglycemic episodes per month 0    Number  of hyperglycemic episodes per week 0    Have you had a dilated eye exam in the past 12 months? No    Have you had a dental exam in the past 12 months? No    Are you checking your feet? Yes    How many days per week are you checking your feet? 7      Dietary Intake   Breakfast quick oatmeal + raisins + coffee (with creamer, no sugar)    Lunch steak (air fryer) + pinto beans + cole slaw + corn bread    Snack (afternoon) sometimes chips or trail mix or apple/banana or sugar-free popsicle    Dinner sweet peas + creamed corn +  chicken (air fryer)    Snack (evening) sometimes chips or trail mix or apple/banana    Beverage(s) coffee (with creamer, no sugar), water (4*16 oz; 64 oz), sugar-free ginger ale (sometimes), hot tea      Exercise   Exercise Type Light (walking / raking leaves)   walking up to 7000 steps/day   How many days per week to you exercise? 5    How many minutes per day do you exercise? 60    Total minutes per week of exercise 300      Patient Education   Previous Diabetes Education No    Disease state  Definition of diabetes,  type 1 and 2, and the diagnosis of diabetes;Factors that contribute to the development of diabetes    Nutrition management  Role of diet in the treatment of diabetes and the relationship between the three main macronutrients and blood glucose level;Food label reading, portion sizes and measuring food.;Meal options for control of blood glucose level and chronic complications.;Reviewed blood glucose goals for pre and post meals and how to evaluate the patients' food intake on their blood glucose level.    Monitoring Purpose and frequency of SMBG.;Interpreting lab values - A1C, lipid, urine microalbumina.;Identified appropriate SMBG and/or A1C goals.    Acute complications Taught treatment of hypoglycemia - the 15 rule.;Discussed and identified patients' treatment of hyperglycemia.    Chronic complications Relationship between chronic complications and blood  glucose control;Lipid levels, blood glucose control and heart disease;Applicable immunizations;Reviewed with patient heart disease, higher risk of, and prevention    Psychosocial adjustment Role of stress on diabetes;Identified and addressed patients feelings and concerns about diabetes      Individualized Goals (developed by patient)   Nutrition General guidelines for healthy choices and portions discussed    Physical Activity 30 minutes per day;Exercise 3-5 times per week    Medications take my medication as prescribed    Monitoring  test my blood glucose as discussed    Reducing Risk examine blood glucose patterns;treat hypoglycemia with 15 grams of carbs if blood glucose less than 70mg /dL    Health Coping Not Applicable      Post-Education Assessment   Patient understands the diabetes disease and treatment process. Demonstrates understanding / competency    Patient understands incorporating nutritional management into lifestyle. Demonstrates understanding / competency    Patient undertands incorporating physical activity into lifestyle. Demonstrates understanding / competency    Patient understands using medications safely. Needs Review    Patient understands monitoring blood glucose, interpreting and using results Demonstrates understanding / competency    Patient understands prevention, detection, and treatment of acute complications. Demonstrates understanding / competency    Patient understands prevention, detection, and treatment of chronic complications. Demonstrates understanding / competency    Patient understands how to develop strategies to address psychosocial issues. Demonstrates understanding / competency    Patient understands how to develop strategies to promote health/change behavior. Demonstrates understanding / competency      Outcomes   Program Status Not Completed           Learning Objective:  Patient will have a greater understanding of diabetes  self-management. Patient education plan is to attend individual and/or group sessions per assessed needs and concerns.   Plan:   Patient Instructions  Goals:  Follow Diabetes Meal Plan as instructed  Eat 3 meals and 2 snacks, every 3-5 hrs  Aim to have 1/2 plate non-starchy vegetables + 1/4 plate protein + 1/4 plate starch/grain  Balance snacks with carbohydrate + protein  Add lean protein foods to meals/snacks  Monitor glucose levels as instructed by your doctor  Aim for 30 mins of physical activity daily  Bring food record and glucose log to your next nutrition visit     Expected Outcomes:  Demonstrated interest in learning. Expect positive outcomes  Education material provided: ADA - How to Thrive: A Guide for Your Journey with Diabetes  If problems or questions, patient to contact team via:  Phone and Email  Future DSME appointment: - 4-6 wks

## 2020-03-21 NOTE — Patient Instructions (Signed)
Goals:  Follow Diabetes Meal Plan as instructed  Eat 3 meals and 2 snacks, every 3-5 hrs  Aim to have 1/2 plate non-starchy vegetables + 1/4 plate protein + 1/4 plate starch/grain  Balance snacks with carbohydrate + protein  Add lean protein foods to meals/snacks  Monitor glucose levels as instructed by your doctor  Aim for 30 mins of physical activity daily  Bring food record and glucose log to your next nutrition visit

## 2020-03-23 ENCOUNTER — Ambulatory Visit (HOSPITAL_BASED_OUTPATIENT_CLINIC_OR_DEPARTMENT_OTHER): Admission: RE | Admit: 2020-03-23 | Payer: Self-pay | Source: Ambulatory Visit

## 2020-03-23 ENCOUNTER — Ambulatory Visit: Payer: PRIVATE HEALTH INSURANCE | Admitting: Family Medicine

## 2020-03-27 ENCOUNTER — Ambulatory Visit: Payer: PRIVATE HEALTH INSURANCE | Admitting: Family Medicine

## 2020-04-10 ENCOUNTER — Other Ambulatory Visit: Payer: Self-pay

## 2020-04-10 ENCOUNTER — Ambulatory Visit: Payer: 59 | Admitting: Family Medicine

## 2020-04-10 ENCOUNTER — Encounter: Payer: Self-pay | Admitting: Family Medicine

## 2020-04-10 VITALS — BP 160/90 | HR 87 | Temp 97.6°F | Resp 18 | Ht 65.0 in | Wt 174.8 lb

## 2020-04-10 DIAGNOSIS — E1165 Type 2 diabetes mellitus with hyperglycemia: Secondary | ICD-10-CM | POA: Diagnosis not present

## 2020-04-10 DIAGNOSIS — E1169 Type 2 diabetes mellitus with other specified complication: Secondary | ICD-10-CM

## 2020-04-10 DIAGNOSIS — E785 Hyperlipidemia, unspecified: Secondary | ICD-10-CM

## 2020-04-10 DIAGNOSIS — I10 Essential (primary) hypertension: Secondary | ICD-10-CM | POA: Diagnosis not present

## 2020-04-10 MED ORDER — SIMVASTATIN 20 MG PO TABS: 20.0000 mg | ORAL_TABLET | Freq: Every day | ORAL | 1 refills | Status: DC

## 2020-04-10 MED ORDER — CARVEDILOL 12.5 MG PO TABS: 12.5000 mg | ORAL_TABLET | Freq: Two times a day (BID) | ORAL | 3 refills | Status: DC

## 2020-04-10 NOTE — Assessment & Plan Note (Signed)
Tolerating statin, encouraged heart healthy diet, avoid trans fats, minimize simple carbs and saturated fats. Increase exercise as tolerated 

## 2020-04-10 NOTE — Progress Notes (Signed)
Patient ID: Jordan Joseph, female    DOB: 1956/07/07  Age: 64 y.o. MRN: [id number]    Subjective:  Subjective  HPI Pegge Cumberledge presents for f/u dm , chol and bp.   Pt sees endo for dm.  No other complaints  HPI HYPERTENSION   Blood pressure range-running 130-145/ 78-88  Chest pain- no      Dyspnea- no Lightheadedness- no   Edema- no  Other side effects - no   Medication compliance: good Low salt diet- yes     DIABETES    Blood Sugar ranges-good per pt   Polyuria- no New Visual problems- no  Hypoglycemic symptoms- no  Other side effects-no Medication compliance - good    HYPERLIPIDEMIA  Medication compliance- good RUQ pain- no  Muscle aches- no Other side effects-no      Review of Systems  Constitutional: Negative for appetite change, diaphoresis, fatigue and unexpected weight change.  Eyes: Negative for pain, redness and visual disturbance.  Respiratory: Negative for cough, chest tightness, shortness of breath and wheezing.   Cardiovascular: Negative for chest pain, palpitations and leg swelling.  Endocrine: Negative for cold intolerance, heat intolerance, polydipsia, polyphagia and polyuria.  Genitourinary: Negative for difficulty urinating, dysuria and frequency.  Neurological: Negative for dizziness, light-headedness, numbness and headaches.    History Past Medical History:  Diagnosis Date  . Diabetes mellitus type II, uncontrolled (HCC)   . HTN (hypertension)     She has no past surgical history on file.   Her family history includes Alzheimer's disease in her mother; Cirrhosis in her father; Diabetes in her brother; Diabetes Mellitus II in her father; Hypertension in her brother and father.She reports that she has never smoked. She has never used smokeless tobacco. No history on file for alcohol use and drug use.  Current Outpatient Medications on File Prior to Visit  Medication Sig Dispense Refill  . amLODipine (NORVASC) 10 MG tablet Take 1 tablet (10  mg total) by mouth daily. 90 tablet 1  . insulin degludec (TRESIBA FLEXTOUCH) 100 UNIT/ML FlexTouch Pen Inject 0.22 mLs (22 Units total) into the skin daily. 15 mL 6  . losartan-hydrochlorothiazide (HYZAAR) 100-25 MG tablet Take 1 tablet by mouth daily. 90 tablet 1  . metFORMIN (GLUCOPHAGE-XR) 500 MG 24 hr tablet Take 1 tablet (500 mg total) by mouth daily with breakfast. 90 tablet 3  . UNABLE TO FIND Med Name: Casimiro Needle blood sugar meter     No current facility-administered medications on file prior to visit.     Objective:  Objective  Physical Exam Vitals and nursing note reviewed.  Constitutional:      Appearance: She is well-developed.  HENT:     Head: Normocephalic and atraumatic.  Eyes:     Conjunctiva/sclera: Conjunctivae normal.  Neck:     Thyroid: No thyromegaly.     Vascular: No carotid bruit or JVD.  Cardiovascular:     Rate and Rhythm: Normal rate and regular rhythm.     Heart sounds: Normal heart sounds. No murmur heard.   Pulmonary:     Effort: Pulmonary effort is normal. No respiratory distress.     Breath sounds: Normal breath sounds. No wheezing or rales.  Chest:     Chest wall: No tenderness.  Musculoskeletal:     Cervical back: Normal range of motion and neck supple.  Neurological:     Mental Status: She is alert and oriented to person, place, and time.    BP (!) 160/90 (BP Location: Right Arm, Patient  Position: Sitting, Cuff Size: Normal)   Pulse 87   Temp 97.6 F (36.4 C) (Temporal)   Resp 18   Ht 5\' 5"  (1.651 m)   Wt 174 lb 12.8 oz (79.3 kg)   SpO2 99%   BMI 29.09 kg/m  Wt Readings from Last 3 Encounters:  04/10/20 174 lb 12.8 oz (79.3 kg)  03/06/20 173 lb 3.2 oz (78.6 kg)  03/02/20 173 lb 3.2 oz (78.6 kg)     Lab Results  Component Value Date   WBC 8.0 02/17/2020   HGB 13.6 02/17/2020   HCT 40.3 02/17/2020   PLT 261.0 02/17/2020   GLUCOSE 377 (H) 02/17/2020   CHOL 199 02/17/2020   TRIG 212.0 (H) 02/17/2020   HDL 46.20 02/17/2020    LDLDIRECT 91.0 02/17/2020   ALT 21 02/17/2020   AST 18 02/17/2020   NA 133 (L) 02/17/2020   K 3.8 02/17/2020   CL 97 02/17/2020   CREATININE 0.83 02/17/2020   BUN 12 02/17/2020   CO2 29 02/17/2020   TSH 2.31 02/17/2020   HGBA1C 13.5 Repeated and verified X2. (H) 02/17/2020   MICROALBUR 20.7 (H) 02/17/2020    Patient was never admitted.   Assessment & Plan:  Plan  I have discontinued Kelby Vetrano's carvedilol. I am also having her start on carvedilol. Additionally, I am having her maintain her amLODipine, losartan-hydrochlorothiazide, UNABLE TO FIND, metFORMIN, Tresiba FlexTouch, and simvastatin.  Meds ordered this encounter  Medications  . simvastatin (ZOCOR) 20 MG tablet    Sig: Take 1 tablet (20 mg total) by mouth at bedtime.    Dispense:  90 tablet    Refill:  1  . carvedilol (COREG) 12.5 MG tablet    Sig: Take 1 tablet (12.5 mg total) by mouth 2 (two) times daily with a meal.    Dispense:  60 tablet    Refill:  3    Problem List Items Addressed This Visit      Unprioritized   Hyperlipidemia associated with type 2 diabetes mellitus (HCC) - Primary    Tolerating statin, encouraged heart healthy diet, avoid trans fats, minimize simple carbs and saturated fats. Increase exercise as tolerated      Relevant Medications   simvastatin (ZOCOR) 20 MG tablet   Hypertension    Poorly controlled will alter medications, encouraged DASH diet, minimize caffeine and obtain adequate sleep. Report concerning symptoms and follow up as directed and as needed      Relevant Medications   simvastatin (ZOCOR) 20 MG tablet   carvedilol (COREG) 12.5 MG tablet   Uncontrolled type 2 diabetes mellitus with hyperglycemia (HCC)    Per endo      Relevant Medications   simvastatin (ZOCOR) 20 MG tablet      Follow-up: Return in about 6 months (around 10/11/2020), or if symptoms worsen or fail to improve, for hypertension, hyperlipidemia, diabetes II.  10/13/2020, DO

## 2020-04-10 NOTE — Assessment & Plan Note (Signed)
Per endo °

## 2020-04-10 NOTE — Patient Instructions (Signed)
Carbohydrate Counting for Diabetes Mellitus, Adult  Carbohydrate counting is a method of keeping track of how many carbohydrates you eat. Eating carbohydrates naturally increases the amount of sugar (glucose) in the blood. Counting how many carbohydrates you eat helps keep your blood glucose within normal limits, which helps you manage your diabetes (diabetes mellitus). It is important to know how many carbohydrates you can safely have in each meal. This is different for every person. A diet and nutrition specialist (registered dietitian) can help you make a meal plan and calculate how many carbohydrates you should have at each meal and snack. Carbohydrates are found in the following foods:  Grains, such as breads and cereals.  Dried beans and soy products.  Starchy vegetables, such as potatoes, peas, and corn.  Fruit and fruit juices.  Milk and yogurt.  Sweets and snack foods, such as cake, cookies, candy, chips, and soft drinks. How do I count carbohydrates? There are two ways to count carbohydrates in food. You can use either of the methods or a combination of both. Reading "Nutrition Facts" on packaged food The "Nutrition Facts" list is included on the labels of almost all packaged foods and beverages in the [place] It includes:  The serving size.  Information about nutrients in each serving, including the grams (g) of carbohydrate per serving. To use the "Nutrition Facts":  Decide how many servings you will have.  Multiply the number of servings by the number of carbohydrates per serving.  The resulting number is the total amount of carbohydrates that you will be having. Learning standard serving sizes of other foods When you eat carbohydrate foods that are not packaged or do not include "Nutrition Facts" on the label, you need to measure the servings in order to count the amount of carbohydrates:  Measure the foods that you will eat with a food scale or measuring cup, if  needed.  Decide how many standard-size servings you will eat.  Multiply the number of servings by 15. Most carbohydrate-rich foods have about 15 g of carbohydrates per serving. ? For example, if you eat 8 oz (170 g) of strawberries, you will have eaten 2 servings and 30 g of carbohydrates (2 servings x 15 g = 30 g).  For foods that have more than one food mixed, such as soups and casseroles, you must count the carbohydrates in each food that is included. The following list contains standard serving sizes of common carbohydrate-rich foods. Each of these servings has about 15 g of carbohydrates:   hamburger bun or  English muffin.   oz (15 mL) syrup.   oz (14 g) jelly.  1 slice of bread.  1 six-inch tortilla.  3 oz (85 g) cooked rice or pasta.  4 oz (113 g) cooked dried beans.  4 oz (113 g) starchy vegetable, such as peas, corn, or potatoes.  4 oz (113 g) hot cereal.  4 oz (113 g) mashed potatoes or  of a large baked potato.  4 oz (113 g) canned or frozen fruit.  4 oz (120 mL) fruit juice.  4-6 crackers.  6 chicken nuggets.  6 oz (170 g) unsweetened dry cereal.  6 oz (170 g) plain fat-free yogurt or yogurt sweetened with artificial sweeteners.  8 oz (240 mL) milk.  8 oz (170 g) fresh fruit or one small piece of fruit.  24 oz (680 g) popped popcorn. Example of carbohydrate counting Sample meal  3 oz (85 g) chicken breast.  6 oz (170 g)   brown rice.  4 oz (113 g) corn.  8 oz (240 mL) milk.  8 oz (170 g) strawberries with sugar-free whipped topping. Carbohydrate calculation 1. Identify the foods that contain carbohydrates: ? Rice. ? Corn. ? Milk. ? Strawberries. 2. Calculate how many servings you have of each food: ? 2 servings rice. ? 1 serving corn. ? 1 serving milk. ? 1 serving strawberries. 3. Multiply each number of servings by 15 g: ? 2 servings rice x 15 g = 30 g. ? 1 serving corn x 15 g = 15 g. ? 1 serving milk x 15 g = 15 g. ? 1  serving strawberries x 15 g = 15 g. 4. Add together all of the amounts to find the total grams of carbohydrates eaten: ? 30 g + 15 g + 15 g + 15 g = 75 g of carbohydrates total. Summary  Carbohydrate counting is a method of keeping track of how many carbohydrates you eat.  Eating carbohydrates naturally increases the amount of sugar (glucose) in the blood.  Counting how many carbohydrates you eat helps keep your blood glucose within normal limits, which helps you manage your diabetes.  A diet and nutrition specialist (registered dietitian) can help you make a meal plan and calculate how many carbohydrates you should have at each meal and snack. This information is not intended to replace advice given to you by your health care provider. Make sure you discuss any questions you have with your health care provider. Document Revised: 04/09/2017 Document Reviewed: 02/27/2016 Elsevier Patient Education  2020 Elsevier Inc.  

## 2020-04-10 NOTE — Assessment & Plan Note (Signed)
Poorly controlled will alter medications, encouraged DASH diet, minimize caffeine and obtain adequate sleep. Report concerning symptoms and follow up as directed and as needed 

## 2020-05-08 ENCOUNTER — Encounter: Payer: 59 | Attending: Family Medicine | Admitting: Registered"

## 2020-05-08 ENCOUNTER — Other Ambulatory Visit: Payer: Self-pay

## 2020-05-08 ENCOUNTER — Encounter: Payer: Self-pay | Admitting: Registered"

## 2020-05-08 DIAGNOSIS — E119 Type 2 diabetes mellitus without complications: Secondary | ICD-10-CM | POA: Insufficient documentation

## 2020-05-08 NOTE — Progress Notes (Signed)
Diabetes Self-Management Education  Visit Type:  Follow-up  Appt. Start Time: 10:05 Appt. End Time: 10:57  05/08/2020  [name], identified by name and date of birth, is a 64 y.o. female with a diagnosis of Diabetes: Type 2.   ASSESSMENT  Pt arrives stating her BP averages about 125-135/84; has been decreasing over time. Checks BP daily.   States she checks BS 2x/day: FBS (239-290) and before lunch (260-300).  States she notices if she eats cereal, oatmeal it will cause her BS numbers to increase to high 300's. States her body does not like carbohydrates. States bread does not fluctuate numbers; able ot have 2 slices per day. States she does not eat corn, green peas, or pancakes. States she is learning what to eat.   States if she exercises before eating it helps to decrease her BS numbers. States she is walking 1-2x/day.   Reports she has upcoming eye exam coming up in 05/2020. States she does not have an upcoming dental exam due to pandemic.   There were no vitals taken for this visit. There is no height or weight on file to calculate BMI.    Diabetes Self-Management Education - 05/08/20 1008      Psychosocial Assessment   Patient Belief/Attitude about Diabetes Motivated to manage diabetes    Self-care barriers None      Complications   How often do you check your blood sugar? 1-2 times/day    Fasting Blood glucose range (mg/dL) >662    Postprandial Blood glucose range (mg/dL) >947    Number of hypoglycemic episodes per month 0    Number of hyperglycemic episodes per week 0    Have you had a dilated eye exam in the past 12 months? No    Have you had a dental exam in the past 12 months? No    Are you checking your feet? Yes    How many days per week are you checking your feet? 7      Dietary Intake   Breakfast egg sandwich (eggs, toast, cheese)    Lunch salad (vegetables) with thousand island dressing    Dinner chicken salad + saltine crackers    Beverage(s)  water (3*16 oz; 48 oz), ginger ale (with no sugar), coffee (with creamer)      Exercise   Exercise Type Light (walking / raking leaves)    How many days per week to you exercise? 3    How many minutes per day do you exercise? 30    Total minutes per week of exercise 90      Patient Education   Physical activity and exercise  Role of exercise on diabetes management, blood pressure control and cardiac health.;Helped patient identify appropriate exercises in relation to his/her diabetes, diabetes complications and other health issue.    Medications Reviewed patients medication for diabetes, action, purpose, timing of dose and side effects.    Monitoring Identified appropriate SMBG and/or A1C goals.;Daily foot exams;Yearly dilated eye exam    Chronic complications Assessed and discussed foot care and prevention of foot problems;Dental care;Retinopathy and reason for yearly dilated eye exams;Nephropathy, what it is, prevention of, the use of ACE, ARB's and early detection of through urine microalbumia.;Reviewed with patient heart disease, higher risk of, and prevention      Individualized Goals (developed by patient)   Nutrition General guidelines for healthy choices and portions discussed    Physical Activity 30 minutes per day    Medications take my medication as  prescribed    Monitoring  test my blood glucose as discussed    Reducing Risk examine blood glucose patterns;do foot checks daily;treat hypoglycemia with 15 grams of carbs if blood glucose less than 70mg /dL    Health Coping Not Applicable      Post-Education Assessment   Patient understands using medications safely. Demonstrates understanding / competency      Outcomes   Program Status Completed      Subsequent Visit   Since your last visit have you continued or begun to take your medications as prescribed? Yes    Since your last visit have you had your blood pressure checked? Yes    Is your most recent blood pressure lower,  unchanged, or higher since your last visit? Lower    Since your last visit have you experienced any weight changes? Loss    Weight Loss (lbs) 1    Since your last visit, are you checking your blood glucose at least once a day? Yes           Learning Objective:  Patient will have a greater understanding of diabetes self-management. Patient education plan is to attend individual and/or group sessions per assessed needs and concerns.   Plan:   There are no Patient Instructions on file for this visit.   Expected Outcomes:  Demonstrated interest in learning. Expect positive outcomes  Education material provided: none  If problems or questions, patient to contact team via:  Phone and Email  Future DSME appointment: - PRN

## 2020-05-29 ENCOUNTER — Other Ambulatory Visit (INDEPENDENT_AMBULATORY_CARE_PROVIDER_SITE_OTHER): Payer: 59

## 2020-05-29 ENCOUNTER — Other Ambulatory Visit: Payer: Self-pay

## 2020-05-29 DIAGNOSIS — E1165 Type 2 diabetes mellitus with hyperglycemia: Secondary | ICD-10-CM

## 2020-05-29 DIAGNOSIS — E1169 Type 2 diabetes mellitus with other specified complication: Secondary | ICD-10-CM

## 2020-05-29 LAB — MICROALBUMIN / CREATININE URINE RATIO
Creatinine, Urine: 64 mg/dL (ref 20–275)
Microalb Creat Ratio: 136 mcg/mg creat — ABNORMAL HIGH (ref <30)
Microalb, Ur: 8.7 mg/dL

## 2020-05-30 ENCOUNTER — Other Ambulatory Visit: Payer: Self-pay | Admitting: Family Medicine

## 2020-05-30 DIAGNOSIS — E1165 Type 2 diabetes mellitus with hyperglycemia: Secondary | ICD-10-CM

## 2020-05-30 LAB — LIPID PANEL
Cholesterol: 145 mg/dL (ref <200)
HDL: 41 mg/dL — ABNORMAL LOW (ref >=50)
LDL Cholesterol (Calc): 81 mg/dL (calc)
Non-HDL Cholesterol (Calc): 104 mg/dL (calc) (ref <130)
Total CHOL/HDL Ratio: 3.5 (calc) (ref <5.0)
Triglycerides: 125 mg/dL (ref <150)

## 2020-05-30 LAB — COMPREHENSIVE METABOLIC PANEL
AG Ratio: 1.4 (calc) (ref 1.0–2.5)
ALT: 19 U/L (ref 6–29)
AST: 13 U/L (ref 10–35)
Albumin: 4.1 g/dL (ref 3.6–5.1)
Alkaline phosphatase (APISO): 64 U/L (ref 37–153)
BUN: 13 mg/dL (ref 7–25)
CO2: 28 mmol/L (ref 20–32)
Calcium: 10 mg/dL (ref 8.6–10.4)
Chloride: 101 mmol/L (ref 98–110)
Creat: 0.75 mg/dL (ref 0.50–0.99)
Globulin: 2.9 g/dL (calc) (ref 1.9–3.7)
Glucose, Bld: 245 mg/dL — ABNORMAL HIGH (ref 65–99)
Potassium: 3.7 mmol/L (ref 3.5–5.3)
Sodium: 137 mmol/L (ref 135–146)
Total Bilirubin: 0.7 mg/dL (ref 0.2–1.2)
Total Protein: 7 g/dL (ref 6.1–8.1)

## 2020-05-30 LAB — HEMOGLOBIN A1C
Hgb A1c MFr Bld: 11.1 % of total Hgb — ABNORMAL HIGH (ref <5.7)
Mean Plasma Glucose: 272 (calc)
eAG (mmol/L): 15.1 (calc)

## 2020-06-05 ENCOUNTER — Encounter: Payer: Self-pay | Admitting: Internal Medicine

## 2020-06-05 ENCOUNTER — Ambulatory Visit: Payer: 59 | Admitting: Internal Medicine

## 2020-06-05 ENCOUNTER — Other Ambulatory Visit: Payer: Self-pay

## 2020-06-05 VITALS — BP 140/80 | HR 86 | Ht 65.0 in | Wt 175.8 lb

## 2020-06-05 DIAGNOSIS — E1165 Type 2 diabetes mellitus with hyperglycemia: Secondary | ICD-10-CM

## 2020-06-05 NOTE — Progress Notes (Signed)
Name: Jordan Joseph  Age/ Sex: 64 y.o., female   MRN/ DOB: [id number], Jan 27, 1956     PCP: Donato Schultz, DO   Reason for Endocrinology Evaluation: Type 2 Diabetes Mellitus  Initial Endocrine Consultative Visit: 03/06/2020    PATIENT IDENTIFIER: [name] is a 64 y.o. female with a past medical history of HTN and T2DM. The patient has followed with Endocrinology clinic since 03/06/2020 for consultative assistance with management of her diabetes.  DIABETIC HISTORY:  Ms. Plouff was diagnosed with T2DM in 2013. Marland Kitchen Her hemoglobin A1c has ranged from 11.1% in 04/2020, peaking at 13.5 % in 2021  ON her initial visit she was on basal insulin and Glipizide. We stopped Glipizide, started Metformin and increased basal insulin   SUBJECTIVE:   During the last visit (03/06/2020): A1c 13.5% . Stopped Glipizide, started Metformin and increased basal insulin   Today (06/05/2020): Ms. Overall is here for a follow up on diabetes management.  She checks her blood sugars 1times daily, preprandial to breakfast The patient has not  had hypoglycemic episodes since the last clinic visit.    HOME DIABETES REGIMEN:  Metformin 500 mg XR, 1 tablet daily with Breakfast  Tresiba 22 units daily      Statin: Yes ACE-I/ARB: Yes     GLUCOSE LOG:  243- 514 mg/dL    DIABETIC COMPLICATIONS: Microvascular complications:    Denies: CKD, retinopathy, neuropathy   Last Eye Exam: Completed 2020  Macrovascular complications:    Denies: CAD, CVA, PVD   HISTORY:  Past Medical History:  Past Medical History:  Diagnosis Date  . Diabetes mellitus type II, uncontrolled (HCC)   . HTN (hypertension)     Past Surgical History: History reviewed. No pertinent surgical history.  Social History:  reports that she has never smoked. She has never used smokeless tobacco. No history on file for alcohol use and drug use. Family History:  Family History  Problem Relation Age of Onset  . Alzheimer's  disease Mother   . Hypertension Father   . Cirrhosis Father   . Diabetes Mellitus II Father   . Diabetes Brother   . Hypertension Brother      HOME MEDICATIONS: Allergies as of 06/05/2020   No Known Allergies     Medication List       Accurate as of June 05, 2020  1:12 PM. If you have any questions, ask your nurse or doctor.        amLODipine 10 MG tablet Commonly known as: NORVASC Take 1 tablet (10 mg total) by mouth daily.   carvedilol 12.5 MG tablet Commonly known as: COREG Take 1 tablet (12.5 mg total) by mouth 2 (two) times daily with a meal.   losartan-hydrochlorothiazide 100-25 MG tablet Commonly known as: HYZAAR Take 1 tablet by mouth daily.   metFORMIN 500 MG 24 hr tablet Commonly known as: GLUCOPHAGE-XR Take 1 tablet (500 mg total) by mouth daily with breakfast.   simvastatin 20 MG tablet Commonly known as: ZOCOR Take 1 tablet (20 mg total) by mouth at bedtime.   [name] FlexTouch 100 UNIT/ML FlexTouch Pen Generic drug: insulin degludec Inject 0.22 mLs (22 Units total) into the skin daily.   UNABLE TO FIND Med Name: Casimiro Needle blood sugar meter        OBJECTIVE:   Vital Signs: BP 140/80 (BP Location: Right Arm, Patient Position: Sitting, Cuff Size: Normal)   Pulse 86   Ht 5\' 5"  (1.651 m)   Wt 175 lb 12.8 oz (  79.7 kg)   SpO2 97%   BMI 29.25 kg/m   Wt Readings from Last 3 Encounters:  06/05/20 175 lb 12.8 oz (79.7 kg)  04/10/20 174 lb 12.8 oz (79.3 kg)  03/06/20 173 lb 3.2 oz (78.6 kg)     Exam: General: Pt appears well and is in NAD  Neck: General: Supple without adenopathy. Thyroid: Thyroid size normal.  No goiter or nodules appreciated. No thyroid bruit.  Lungs: Clear with good BS bilat with no rales, rhonchi, or wheezes  Heart: RRR with normal S1 and S2 and no gallops; no murmurs; no rub  Abdomen: Normoactive bowel sounds, soft, nontender, without masses or organomegaly palpable  Extremities: No pretibial edema.   Neuro: MS is good  with appropriate affect, pt is alert and Ox3    DM foot exam: 06/05/2020  The skin of the feet is intact without sores or ulcerations. The pedal pulses are 2+ on right and 2+ on left. The sensation is intact to a screening 5.07, 10 gram monofilament bilaterally    DATA REVIEWED:  Lab Results  Component Value Date   HGBA1C 11.1 (H) 05/29/2020   HGBA1C 13.5 Repeated and verified X2. (H) 02/17/2020   Lab Results  Component Value Date   MICROALBUR 8.7 05/29/2020   LDLCALC 81 05/29/2020   CREATININE 0.75 05/29/2020   Lab Results  Component Value Date   MICRALBCREAT 136 (H) 05/29/2020     Lab Results  Component Value Date   CHOL 145 05/29/2020   HDL 41 (L) 05/29/2020   LDLCALC 81 05/29/2020   LDLDIRECT 91.0 02/17/2020   TRIG 125 05/29/2020   CHOLHDL 3.5 05/29/2020         ASSESSMENT / PLAN / RECOMMENDATIONS:   1) Type 2 Diabetes Mellitus, Poorly controlled, Without complications - Most recent A1c of 11.1 %. Goal A1c < 7.0 %.    - A1c down from 13.5%  - Pt has declined prandial insulin on last visit due to concerns about the cost. Today she again declined add-On therapy due to concerns about side effects  . I have offered pt assistance program coupons etc for cost issues.  - Will titrate her metformin and in the meantime she would like to continue to work on her  Diet and trying to incorporate exercise      MEDICATIONS:  Increase Metformin 500 mg 2 tabs BID   Continue Tresiba 22 units daily   EDUCATION / INSTRUCTIONS:  BG monitoring instructions: Patient is instructed to check her blood sugars 1 times a day, fasting   Call [facility] if: BG persistently < 70  . I reviewed the Rule of 15 for the treatment of hypoglycemia in detail with the patient. Literature supplied.   2) Diabetic complications:   Eye: Does not have known diabetic retinopathy.   Neuro/ Feet: Does not have known diabetic peripheral neuropathy .   Renal: Patient does not  have known baseline CKD. She   is  on an ACEI/ARB at present.     F/U in 3 months    Signed electronically by: Lyndle Herrlich, MD  [facility] Endocrinology  [facility] [address] [place] [place], [place] [address] Phone: [phone] FAX: [phone]   CC: Virgina Organ [address] [facility] [address] [place] [address] Phone: [phone]  Fax: [phone]  Return to Endocrinology clinic as below: Future Appointments  Date Time Provider Department Center  10/16/2020  2:00 PM Seabron Spates  R, DO [facility]

## 2020-06-05 NOTE — Patient Instructions (Addendum)
-   Start Metformin 1 tablet daily with Breakfast  And 1 tablet  with Supper for  1 week, then increase to 2 tablets with Breakfast  and 1 tablet with Supper for another 1 week , then finally 2 Tablets with Breakfast and 2 tablets with Supper.  - Continue Tresiba 22 units daily       HOW TO TREAT LOW BLOOD SUGARS (Blood sugar LESS THAN 70 MG/DL)  Please follow the RULE OF 15 for the treatment of hypoglycemia treatment (when your (blood sugars are less than 70 mg/dL)    STEP 1: Take 15 grams of carbohydrates when your blood sugar is low, which includes:   3-4 GLUCOSE TABS  OR  3-4 OZ OF JUICE OR REGULAR SODA OR  ONE TUBE OF GLUCOSE GEL     STEP 2: RECHECK blood sugar in 15 MINUTES STEP 3: If your blood sugar is still low at the 15 minute recheck --> then, go back to STEP 1 and treat AGAIN with another 15 grams of carbohydrates.

## 2020-07-18 ENCOUNTER — Other Ambulatory Visit: Payer: Self-pay | Admitting: *Deleted

## 2020-07-18 DIAGNOSIS — I10 Essential (primary) hypertension: Secondary | ICD-10-CM

## 2020-07-18 MED ORDER — CARVEDILOL 12.5 MG PO TABS: 12.5000 mg | ORAL_TABLET | Freq: Two times a day (BID) | ORAL | 0 refills | Status: DC

## 2020-07-31 ENCOUNTER — Encounter: Payer: Self-pay | Admitting: Family Medicine

## 2020-07-31 DIAGNOSIS — I10 Essential (primary) hypertension: Secondary | ICD-10-CM

## 2020-07-31 MED ORDER — AMLODIPINE BESYLATE 10 MG PO TABS: 10.0000 mg | ORAL_TABLET | Freq: Every day | ORAL | 0 refills | Status: DC

## 2020-07-31 MED ORDER — LOSARTAN POTASSIUM-HCTZ 100-25 MG PO TABS: 1.0000 | ORAL_TABLET | Freq: Every day | ORAL | 0 refills | Status: DC

## 2020-09-04 ENCOUNTER — Ambulatory Visit: Payer: 59 | Admitting: Internal Medicine

## 2020-09-04 ENCOUNTER — Encounter: Payer: Self-pay | Admitting: Internal Medicine

## 2020-09-04 ENCOUNTER — Other Ambulatory Visit: Payer: Self-pay

## 2020-09-04 ENCOUNTER — Ambulatory Visit: Payer: 59 | Admitting: Family Medicine

## 2020-09-04 VITALS — BP 134/80 | HR 88 | Ht 65.0 in | Wt 175.2 lb

## 2020-09-04 DIAGNOSIS — E1165 Type 2 diabetes mellitus with hyperglycemia: Secondary | ICD-10-CM | POA: Diagnosis not present

## 2020-09-04 LAB — GLUCOSE, POCT (MANUAL RESULT ENTRY): POC Glucose: 216 mg/dl — AB (ref 70–99)

## 2020-09-04 LAB — POCT GLYCOSYLATED HEMOGLOBIN (HGB A1C): Hemoglobin A1C: 9.2 % — AB (ref 4.0–5.6)

## 2020-09-04 NOTE — Progress Notes (Signed)
Name: Jordan Joseph  Age/ Sex: 64 y.o., female   MRN/ DOB: [id number], September 30, 1955     PCP: Donato Schultz, DO   Reason for Endocrinology Evaluation: Type 2 Diabetes Mellitus  Initial Endocrine Consultative Visit: 03/06/2020    PATIENT IDENTIFIER: Jordan Joseph is a 64 y.o. female with a past medical history of HTN and T2DM. The patient has followed with Endocrinology clinic since 03/06/2020 for consultative assistance with management of her diabetes.  DIABETIC HISTORY:  [name] was diagnosed with T2DM in 2013. Marland Kitchen Her hemoglobin A1c has ranged from 11.1% in 04/2020, peaking at 13.5 % in 2021  ON her initial visit she was on basal insulin and Glipizide. We stopped Glipizide, started Metformin and increased basal insulin   SUBJECTIVE:   During the last visit (06/05/2020): A1c 11.1% . We increased Metformin and continued basal insulin      Today (09/04/2020): Ms. Babin is here for a follow up on diabetes management.  She checks her blood sugars 1 times daily, preprandial to breakfast The patient has not  had hypoglycemic episodes since the last clinic visit.   Denies nausea or vomiting  Denies either    HOME DIABETES REGIMEN:  Metformin 500 mg XR, 2 tablet BID Tresiba 22 units daily      Statin: Yes ACE-I/ARB: Yes     GLUCOSE LOG:  190-250 mg/dL    DIABETIC COMPLICATIONS: Microvascular complications:    Denies: CKD, retinopathy, neuropathy   Last Eye Exam: Completed 2020  Macrovascular complications:    Denies: CAD, CVA, PVD   HISTORY:  Past Medical History:  Past Medical History:  Diagnosis Date  . Diabetes mellitus type II, uncontrolled (HCC)   . HTN (hypertension)     Past Surgical History: No past surgical history on file.  Social History:  reports that she has never smoked. She has never used smokeless tobacco. No history on file for alcohol use and drug use. Family History:  Family History  Problem Relation Age of Onset  . Alzheimer's  disease Mother   . Hypertension Father   . Cirrhosis Father   . Diabetes Mellitus II Father   . Diabetes Brother   . Hypertension Brother      HOME MEDICATIONS: Allergies as of 09/04/2020   No Known Allergies     Medication List       Accurate as of September 04, 2020  1:39 PM. If you have any questions, ask your nurse or doctor.        amLODipine 10 MG tablet Commonly known as: NORVASC Take 1 tablet (10 mg total) by mouth daily.   carvedilol 12.5 MG tablet Commonly known as: COREG Take 1 tablet (12.5 mg total) by mouth 2 (two) times daily with a meal.   losartan-hydrochlorothiazide 100-25 MG tablet Commonly known as: HYZAAR Take 1 tablet by mouth daily.   metFORMIN 500 MG 24 hr tablet Commonly known as: GLUCOPHAGE-XR Take 1 tablet (500 mg total) by mouth daily with breakfast. What changed:   how much to take  when to take this   simvastatin 20 MG tablet Commonly known as: ZOCOR Take 1 tablet (20 mg total) by mouth at bedtime.   [name] FlexTouch 100 UNIT/ML FlexTouch Pen Generic drug: insulin degludec Inject 0.22 mLs (22 Units total) into the skin daily.   UNABLE TO FIND Med Name: Casimiro Needle blood sugar meter        OBJECTIVE:   Vital Signs: BP 134/80   Pulse 88   Ht  5\' 5"  (1.651 m)   Wt 175 lb 4 oz (79.5 kg)   LMP  (LMP Unknown)   SpO2 98%   BMI 29.16 kg/m   Wt Readings from Last 3 Encounters:  09/04/20 175 lb 4 oz (79.5 kg)  06/05/20 175 lb 12.8 oz (79.7 kg)  04/10/20 174 lb 12.8 oz (79.3 kg)     Exam: General: Pt appears well and is in NAD  Neck: General: Supple without adenopathy. Thyroid: Thyroid size normal.  No goiter or nodules appreciated. No thyroid bruit.  Lungs: Clear with good BS bilat with no rales, rhonchi, or wheezes  Heart: RRR with normal S1 and S2 and no gallops; no murmurs; no rub  Abdomen: Normoactive bowel sounds, soft, nontender, without masses or organomegaly palpable  Extremities: No pretibial edema.   Neuro: MS is  good with appropriate affect, pt is alert and Ox3    DM foot exam: 06/05/2020  The skin of the feet is intact without sores or ulcerations. The pedal pulses are 2+ on right and 2+ on left. The sensation is intact to a screening 5.07, 10 gram monofilament bilaterally    DATA REVIEWED:  Lab Results  Component Value Date   HGBA1C 9.2 (A) 09/04/2020   HGBA1C 11.1 (H) 05/29/2020   HGBA1C 13.5 Repeated and verified X2. (H) 02/17/2020   Lab Results  Component Value Date   MICROALBUR 8.7 05/29/2020   LDLCALC 81 05/29/2020   CREATININE 0.75 05/29/2020   Lab Results  Component Value Date   MICRALBCREAT 136 (H) 05/29/2020     Lab Results  Component Value Date   CHOL 145 05/29/2020   HDL 41 (L) 05/29/2020   LDLCALC 81 05/29/2020   LDLDIRECT 91.0 02/17/2020   TRIG 125 05/29/2020   CHOLHDL 3.5 05/29/2020         ASSESSMENT / PLAN / RECOMMENDATIONS:   1) Type 2 Diabetes Mellitus, Poorly controlled, Without complications - Most recent A1c of 11.1 %. Goal A1c < 7.0 %.     - I have praised the pt on lifestyle changes and improving glycemic control  - She is tolerating Metformin without side effect.  - Pt has declined prandial insulin and any add-On therapy in the past due to concerns about side effects  . - She would like to keep the current dose of insulin at 22 units and not increase at this time    MEDICATIONS:  Continue Metformin 500 mg 2 tabs BID   Continue Tresiba 22 units daily   EDUCATION / INSTRUCTIONS:  BG monitoring instructions: Patient is instructed to check her blood sugars 1 times a day, fasting   Call [place] [facility] if: BG persistently < 70  . I reviewed the Rule of 15 for the treatment of hypoglycemia in detail with the patient. Literature supplied.   2) Diabetic complications:   Eye: Does not have known diabetic retinopathy.   Neuro/ Feet: Does not have known diabetic peripheral neuropathy .   Renal: Patient does not have known  baseline CKD. She   is  on an ACEI/ARB at present.     F/U in 3 months    Signed electronically by: 05/31/2020, MD  [facility] Endocrinology  [facility] [address] [place] [place], [place] Phone: [phone] FAX: [phone]   CC: [phone] [address] [facility] [address] [place] [facility] [place] Phone: [phone]  Fax: [phone]  Return to Endocrinology clinic as below: Future Appointments  Date Time Provider Department Center  10/16/2020  2:00 PM Donato Schultz, DO [facility]

## 2020-09-04 NOTE — Patient Instructions (Addendum)
-   Keep up the Good Work! - Continue Metformin 2 tablets Twice a day  - Continue tresiba 22 units daily     HOW TO TREAT LOW BLOOD SUGARS (Blood sugar LESS THAN 70 MG/DL)  Please follow the RULE OF 15 for the treatment of hypoglycemia treatment (when your (blood sugars are less than 70 mg/dL)    STEP 1: Take 15 grams of carbohydrates when your blood sugar is low, which includes:   3-4 GLUCOSE TABS  OR  3-4 OZ OF JUICE OR REGULAR SODA OR  ONE TUBE OF GLUCOSE GEL     STEP 2: RECHECK blood sugar in 15 MINUTES STEP 3: If your blood sugar is still low at the 15 minute recheck --> then, go back to STEP 1 and treat AGAIN with another 15 grams of carbohydrates.

## 2020-09-07 ENCOUNTER — Other Ambulatory Visit: Payer: Self-pay | Admitting: Family Medicine

## 2020-09-07 DIAGNOSIS — I10 Essential (primary) hypertension: Secondary | ICD-10-CM

## 2020-09-10 ENCOUNTER — Telehealth: Payer: Self-pay | Admitting: Internal Medicine

## 2020-09-10 MED ORDER — METFORMIN HCL ER 500 MG PO TB24
1000.0000 mg | ORAL_TABLET | Freq: Two times a day (BID) | ORAL | 1 refills | Status: DC
Start: 2020-09-10 — End: 2021-03-12

## 2020-09-10 NOTE — Telephone Encounter (Signed)
Patient needs an updated RX for metFORMIN (GLUCOPHAGE-XR) 500 MG 24 hr to be called into [facility] on [address]

## 2020-09-10 NOTE — Telephone Encounter (Signed)
Rx have sent to [facility] for Metformin 2 BID

## 2020-09-11 ENCOUNTER — Encounter: Payer: Self-pay | Admitting: Family Medicine

## 2020-09-11 ENCOUNTER — Ambulatory Visit: Payer: 59 | Admitting: Family Medicine

## 2020-09-11 ENCOUNTER — Other Ambulatory Visit: Payer: Self-pay

## 2020-09-11 DIAGNOSIS — E1169 Type 2 diabetes mellitus with other specified complication: Secondary | ICD-10-CM | POA: Diagnosis not present

## 2020-09-11 DIAGNOSIS — E785 Hyperlipidemia, unspecified: Secondary | ICD-10-CM | POA: Diagnosis not present

## 2020-09-11 DIAGNOSIS — I10 Essential (primary) hypertension: Secondary | ICD-10-CM | POA: Diagnosis not present

## 2020-09-11 MED ORDER — AMLODIPINE BESYLATE 10 MG PO TABS: 10.0000 mg | ORAL_TABLET | Freq: Every day | ORAL | 1 refills | Status: DC

## 2020-09-11 MED ORDER — SIMVASTATIN 20 MG PO TABS: 20.0000 mg | ORAL_TABLET | Freq: Every day | ORAL | 1 refills | Status: DC

## 2020-09-11 MED ORDER — AMLODIPINE BESYLATE 10 MG PO TABS: 10.0000 mg | ORAL_TABLET | Freq: Every day | ORAL | 0 refills | Status: DC

## 2020-09-11 MED ORDER — LOSARTAN POTASSIUM-HCTZ 100-25 MG PO TABS: 1.0000 | ORAL_TABLET | Freq: Every day | ORAL | 1 refills | Status: DC

## 2020-09-11 MED ORDER — CARVEDILOL 12.5 MG PO TABS
12.5000 mg | ORAL_TABLET | Freq: Two times a day (BID) | ORAL | 0 refills | Status: DC
Start: 2020-09-11 — End: 2020-09-11

## 2020-09-11 MED ORDER — CARVEDILOL 12.5 MG PO TABS: 12.5000 mg | ORAL_TABLET | Freq: Two times a day (BID) | ORAL | 1 refills | Status: DC

## 2020-09-11 NOTE — Assessment & Plan Note (Signed)
Instructed pt to take meds regularly  Nurse visit in 2-3 weeks for bp check

## 2020-09-11 NOTE — Assessment & Plan Note (Signed)
Tolerating statin, encouraged heart healthy diet, avoid trans fats, minimize simple carbs and saturated fats. Increase exercise as tolerated 

## 2020-09-11 NOTE — Patient Instructions (Signed)
DASH Eating Plan DASH stands for "Dietary Approaches to Stop Hypertension." The DASH eating plan is a healthy eating plan that has been shown to reduce high blood pressure (hypertension). It may also reduce your risk for type 2 diabetes, heart disease, and stroke. The DASH eating plan may also help with weight loss. What are tips for following this plan?  General guidelines  Avoid eating more than 2,300 mg (milligrams) of salt (sodium) a day. If you have hypertension, you may need to reduce your sodium intake to 1,500 mg a day.  Limit alcohol intake to no more than 1 drink a day for nonpregnant women and 2 drinks a day for men. One drink equals 12 oz of beer, 5 oz of wine, or 1 oz of hard liquor.  Work with your health care provider to maintain a healthy body weight or to lose weight. Ask what an ideal weight is for you.  Get at least 30 minutes of exercise that causes your heart to beat faster (aerobic exercise) most days of the week. Activities may include walking, swimming, or biking.  Work with your health care provider or diet and nutrition specialist (dietitian) to adjust your eating plan to your individual calorie needs. Reading food labels   Check food labels for the amount of sodium per serving. Choose foods with less than 5 percent of the Daily Value of sodium. Generally, foods with less than 300 mg of sodium per serving fit into this eating plan.  To find whole grains, look for the word "whole" as the first word in the ingredient list. Shopping  Buy products labeled as "low-sodium" or "no salt added."  Buy fresh foods. Avoid canned foods and premade or frozen meals. Cooking  Avoid adding salt when cooking. Use salt-free seasonings or herbs instead of table salt or sea salt. Check with your health care provider or pharmacist before using salt substitutes.  Do not fry foods. Cook foods using healthy methods such as baking, boiling, grilling, and broiling instead.  Cook with  heart-healthy oils, such as olive, canola, soybean, or sunflower oil. Meal planning  Eat a balanced diet that includes: ? 5 or more servings of fruits and vegetables each day. At each meal, try to fill half of your plate with fruits and vegetables. ? Up to 6-8 servings of whole grains each day. ? Less than 6 oz of lean meat, poultry, or fish each day. A 3-oz serving of meat is about the same size as a deck of cards. One egg equals 1 oz. ? 2 servings of low-fat dairy each day. ? A serving of nuts, seeds, or beans 5 times each week. ? Heart-healthy fats. Healthy fats called Omega-3 fatty acids are found in foods such as flaxseeds and coldwater fish, like sardines, salmon, and mackerel.  Limit how much you eat of the following: ? Canned or prepackaged foods. ? Food that is high in trans fat, such as fried foods. ? Food that is high in saturated fat, such as fatty meat. ? Sweets, desserts, sugary drinks, and other foods with added sugar. ? Full-fat dairy products.  Do not salt foods before eating.  Try to eat at least 2 vegetarian meals each week.  Eat more home-cooked food and less restaurant, buffet, and fast food.  When eating at a restaurant, ask that your food be prepared with less salt or no salt, if possible. What foods are recommended? The items listed may not be a complete list. Talk with your dietitian about   what dietary choices are best for you. Grains Whole-grain or whole-wheat bread. Whole-grain or whole-wheat pasta. Brown rice. Oatmeal. Quinoa. Bulgur. Whole-grain and low-sodium cereals. Pita bread. Low-fat, low-sodium crackers. Whole-wheat flour tortillas. Vegetables Fresh or frozen vegetables (raw, steamed, roasted, or grilled). Low-sodium or reduced-sodium tomato and vegetable juice. Low-sodium or reduced-sodium tomato sauce and tomato paste. Low-sodium or reduced-sodium canned vegetables. Fruits All fresh, dried, or frozen fruit. Canned fruit in natural juice (without  added sugar). Meat and other protein foods Skinless chicken or turkey. Ground chicken or turkey. Pork with fat trimmed off. Fish and seafood. Egg whites. Dried beans, peas, or lentils. Unsalted nuts, nut butters, and seeds. Unsalted canned beans. Lean cuts of beef with fat trimmed off. Low-sodium, lean deli meat. Dairy Low-fat (1%) or fat-free (skim) milk. Fat-free, low-fat, or reduced-fat cheeses. Nonfat, low-sodium ricotta or cottage cheese. Low-fat or nonfat yogurt. Low-fat, low-sodium cheese. Fats and oils Soft margarine without trans fats. Vegetable oil. Low-fat, reduced-fat, or light mayonnaise and salad dressings (reduced-sodium). Canola, safflower, olive, soybean, and sunflower oils. Avocado. Seasoning and other foods Herbs. Spices. Seasoning mixes without salt. Unsalted popcorn and pretzels. Fat-free sweets. What foods are not recommended? The items listed may not be a complete list. Talk with your dietitian about what dietary choices are best for you. Grains Baked goods made with fat, such as croissants, muffins, or some breads. Dry pasta or rice meal packs. Vegetables Creamed or fried vegetables. Vegetables in a cheese sauce. Regular canned vegetables (not low-sodium or reduced-sodium). Regular canned tomato sauce and paste (not low-sodium or reduced-sodium). Regular tomato and vegetable juice (not low-sodium or reduced-sodium). Pickles. Olives. Fruits Canned fruit in a light or heavy syrup. Fried fruit. Fruit in cream or butter sauce. Meat and other protein foods Fatty cuts of meat. Ribs. Fried meat. Bacon. Sausage. Bologna and other processed lunch meats. Salami. Fatback. Hotdogs. Bratwurst. Salted nuts and seeds. Canned beans with added salt. Canned or smoked fish. Whole eggs or egg yolks. Chicken or turkey with skin. Dairy Whole or 2% milk, cream, and half-and-half. Whole or full-fat cream cheese. Whole-fat or sweetened yogurt. Full-fat cheese. Nondairy creamers. Whipped toppings.  Processed cheese and cheese spreads. Fats and oils Butter. Stick margarine. Lard. Shortening. Ghee. Bacon fat. Tropical oils, such as coconut, palm kernel, or palm oil. Seasoning and other foods Salted popcorn and pretzels. Onion salt, garlic salt, seasoned salt, table salt, and sea salt. Worcestershire sauce. Tartar sauce. Barbecue sauce. Teriyaki sauce. Soy sauce, including reduced-sodium. Steak sauce. Canned and packaged gravies. Fish sauce. Oyster sauce. Cocktail sauce. Horseradish that you find on the shelf. Ketchup. Mustard. Meat flavorings and tenderizers. Bouillon cubes. Hot sauce and Tabasco sauce. Premade or packaged marinades. Premade or packaged taco seasonings. Relishes. Regular salad dressings. Where to find more information:  National Heart, Lung, and Blood Institute: www.nhlbi.nih.gov  American Heart Association: www.heart.org Summary  The DASH eating plan is a healthy eating plan that has been shown to reduce high blood pressure (hypertension). It may also reduce your risk for type 2 diabetes, heart disease, and stroke.  With the DASH eating plan, you should limit salt (sodium) intake to 2,300 mg a day. If you have hypertension, you may need to reduce your sodium intake to 1,500 mg a day.  When on the DASH eating plan, aim to eat more fresh fruits and vegetables, whole grains, lean proteins, low-fat dairy, and heart-healthy fats.  Work with your health care provider or diet and nutrition specialist (dietitian) to adjust your eating plan to your   individual calorie needs. This information is not intended to replace advice given to you by your health care provider. Make sure you discuss any questions you have with your health care provider. Document Revised: 08/28/2017 Document Reviewed: 09/08/2016 Elsevier Patient Education  2020 [employer] Inc.  

## 2020-09-11 NOTE — Progress Notes (Signed)
Patient ID: Jordan Joseph, female    DOB: [birth year]  Age: 64 y.o. MRN: [id number]    Subjective:  Subjective  HPI Jordan Joseph presents for f/u bp and cholesterol  She has not been taking the bp meds regularly  No other complaints   Review of Systems  Constitutional: Negative for appetite change, diaphoresis, fatigue and unexpected weight change.  Eyes: Negative for pain, redness and visual disturbance.  Respiratory: Negative for cough, chest tightness, shortness of breath and wheezing.   Cardiovascular: Negative for chest pain, palpitations and leg swelling.  Endocrine: Negative for cold intolerance, heat intolerance, polydipsia, polyphagia and polyuria.  Genitourinary: Negative for difficulty urinating, dysuria and frequency.  Neurological: Negative for dizziness, light-headedness, numbness and headaches.    History Past Medical History:  Diagnosis Date  . Diabetes mellitus type II, uncontrolled (HCC)   . HTN (hypertension)     She has no past surgical history on file.   Her family history includes Alzheimer's disease in her mother; Cirrhosis in her father; Diabetes in her brother; Diabetes Mellitus II in her father; Hypertension in her brother and father.She reports that she has never smoked. She has never used smokeless tobacco. No history on file for alcohol use and drug use.  Current Outpatient Medications on File Prior to Visit  Medication Sig Dispense Refill  . insulin degludec (TRESIBA FLEXTOUCH) 100 UNIT/ML FlexTouch Pen Inject 0.22 mLs (22 Units total) into the skin daily. 15 mL 6  . metFORMIN (GLUCOPHAGE-XR) 500 MG 24 hr tablet Take 2 tablets (1,000 mg total) by mouth in the morning and at bedtime. 180 tablet 1  . UNABLE TO FIND Med Name: Casimiro Needle blood sugar meter     No current facility-administered medications on file prior to visit.     Objective:  Objective  Physical Exam Vitals and nursing note reviewed.  Constitutional:      Appearance: She is well-developed  and well-nourished.  HENT:     Head: Normocephalic and atraumatic.  Eyes:     Extraocular Movements: EOM normal.     Conjunctiva/sclera: Conjunctivae normal.  Neck:     Thyroid: No thyromegaly.     Vascular: No carotid bruit or JVD.  Cardiovascular:     Rate and Rhythm: Normal rate and regular rhythm.     Heart sounds: Normal heart sounds. No murmur heard.   Pulmonary:     Effort: Pulmonary effort is normal. No respiratory distress.     Breath sounds: Normal breath sounds. No wheezing or rales.  Chest:     Chest wall: No tenderness.  Musculoskeletal:        General: No edema.     Cervical back: Normal range of motion and neck supple.  Neurological:     Mental Status: She is alert and oriented to person, place, and time.  Psychiatric:        Mood and Affect: Mood and affect normal.      BP (!) 150/90 (BP Location: Left Arm, Patient Position: Sitting, Cuff Size: Normal)   Pulse 86   Temp 99.3 F (37.4 C) (Oral)   Resp 18   Ht 5\' 5"  (1.651 m)   Wt 177 lb (80.3 kg)   LMP  (LMP Unknown)   SpO2 98%   BMI 29.45 kg/m  Wt Readings from Last 3 Encounters:  09/11/20 177 lb (80.3 kg)  09/04/20 175 lb 4 oz (79.5 kg)  06/05/20 175 lb 12.8 oz (79.7 kg)     Lab Results  Component Value Date  WBC 8.0 02/17/2020   HGB 13.6 02/17/2020   HCT 40.3 02/17/2020   PLT 261.0 02/17/2020   GLUCOSE 245 (H) 05/29/2020   CHOL 145 05/29/2020   TRIG 125 05/29/2020   HDL 41 (L) 05/29/2020   LDLDIRECT 91.0 02/17/2020   LDLCALC 81 05/29/2020   ALT 19 05/29/2020   AST 13 05/29/2020   NA 137 05/29/2020   K 3.7 05/29/2020   CL 101 05/29/2020   CREATININE 0.75 05/29/2020   BUN 13 05/29/2020   CO2 28 05/29/2020   TSH 2.31 02/17/2020   HGBA1C 9.2 (A) 09/04/2020   MICROALBUR 8.7 05/29/2020    Patient was never admitted.   Assessment & Plan:  Plan  I am having Jordan Amen "Hassell Done" maintain her UNABLE TO FIND, [name] FlexTouch, metFORMIN, simvastatin, carvedilol,  losartan-hydrochlorothiazide, and amLODipine.  Meds ordered this encounter  Medications  . DISCONTD: amLODipine (NORVASC) 10 MG tablet    Sig: Take 1 tablet (10 mg total) by mouth daily.    Dispense:  30 tablet    Refill:  0    Requested drug refills are authorized, however, the patient needs further evaluation and/or laboratory testing before further refills are given. Ask her to make an appointment for this.  . simvastatin (ZOCOR) 20 MG tablet    Sig: Take 1 tablet (20 mg total) by mouth at bedtime.    Dispense:  90 tablet    Refill:  1  . DISCONTD: carvedilol (COREG) 12.5 MG tablet    Sig: Take 1 tablet (12.5 mg total) by mouth 2 (two) times daily with a meal.    Dispense:  180 tablet    Refill:  0  . carvedilol (COREG) 12.5 MG tablet    Sig: Take 1 tablet (12.5 mg total) by mouth 2 (two) times daily with a meal.    Dispense:  180 tablet    Refill:  1  . losartan-hydrochlorothiazide (HYZAAR) 100-25 MG tablet    Sig: Take 1 tablet by mouth daily.    Dispense:  90 tablet    Refill:  1  . amLODipine (NORVASC) 10 MG tablet    Sig: Take 1 tablet (10 mg total) by mouth daily.    Dispense:  90 tablet    Refill:  1    Requested drug refills are authorized, however, the patient needs further evaluation and/or laboratory testing before further refills are given. Ask her to make an appointment for this.    Problem List Items Addressed This Visit      Unprioritized   Hyperlipidemia associated with type 2 diabetes mellitus (HCC)    Tolerating statin, encouraged heart healthy diet, avoid trans fats, minimize simple carbs and saturated fats. Increase exercise as tolerated      Relevant Medications   simvastatin (ZOCOR) 20 MG tablet   losartan-hydrochlorothiazide (HYZAAR) 100-25 MG tablet   Other Relevant Orders   Lipid panel   Comprehensive metabolic panel   Hypertension    Instructed pt to take meds regularly  Nurse visit in 2-3 weeks for bp check       Relevant Medications    simvastatin (ZOCOR) 20 MG tablet   carvedilol (COREG) 12.5 MG tablet   losartan-hydrochlorothiazide (HYZAAR) 100-25 MG tablet   amLODipine (NORVASC) 10 MG tablet    Other Visit Diagnoses    Essential hypertension       Relevant Medications   simvastatin (ZOCOR) 20 MG tablet   carvedilol (COREG) 12.5 MG tablet   losartan-hydrochlorothiazide (HYZAAR) 100-25 MG tablet  amLODipine (NORVASC) 10 MG tablet      Follow-up: Return in about 3 weeks (around 10/02/2020), or if symptoms worsen or fail to improve, for nurse visit for bp check ---- ov in 6 months .  Donato Schultz, DO

## 2020-09-12 LAB — LIPID PANEL
Cholesterol: 160 mg/dL (ref 0–200)
HDL: 34.3 mg/dL — ABNORMAL LOW (ref >39.00)
NonHDL: 125.44
Total CHOL/HDL Ratio: 5
Triglycerides: 253 mg/dL — ABNORMAL HIGH (ref 0.0–149.0)
VLDL: 50.6 mg/dL — ABNORMAL HIGH (ref 0.0–40.0)

## 2020-09-12 LAB — COMPREHENSIVE METABOLIC PANEL
ALT: 16 U/L (ref 0–35)
AST: 12 U/L (ref 0–37)
Albumin: 4.1 g/dL (ref 3.5–5.2)
Alkaline Phosphatase: 57 U/L (ref 39–117)
BUN: 14 mg/dL (ref 6–23)
CO2: 30 mEq/L (ref 19–32)
Calcium: 9.5 mg/dL (ref 8.4–10.5)
Chloride: 98 mEq/L (ref 96–112)
Creatinine, Ser: 1.04 mg/dL (ref 0.40–1.20)
GFR: 57.08 mL/min — ABNORMAL LOW (ref >60.00)
Glucose, Bld: 330 mg/dL — ABNORMAL HIGH (ref 70–99)
Potassium: 3.8 mEq/L (ref 3.5–5.1)
Sodium: 135 mEq/L (ref 135–145)
Total Bilirubin: 0.5 mg/dL (ref 0.2–1.2)
Total Protein: 7 g/dL (ref 6.0–8.3)

## 2020-09-12 LAB — LDL CHOLESTEROL, DIRECT: Direct LDL: 82 mg/dL

## 2020-09-17 ENCOUNTER — Other Ambulatory Visit: Payer: Self-pay | Admitting: Family Medicine

## 2020-09-17 DIAGNOSIS — E1169 Type 2 diabetes mellitus with other specified complication: Secondary | ICD-10-CM

## 2020-09-18 ENCOUNTER — Other Ambulatory Visit: Payer: Self-pay | Admitting: *Deleted

## 2020-09-18 DIAGNOSIS — I10 Essential (primary) hypertension: Secondary | ICD-10-CM

## 2020-09-18 DIAGNOSIS — E785 Hyperlipidemia, unspecified: Secondary | ICD-10-CM

## 2020-09-18 NOTE — Progress Notes (Signed)
error 

## 2020-10-01 ENCOUNTER — Other Ambulatory Visit: Payer: Self-pay

## 2020-10-02 ENCOUNTER — Ambulatory Visit (INDEPENDENT_AMBULATORY_CARE_PROVIDER_SITE_OTHER): Payer: 59

## 2020-10-02 DIAGNOSIS — I10 Essential (primary) hypertension: Secondary | ICD-10-CM

## 2020-10-02 MED ORDER — CARVEDILOL 12.5 MG PO TABS: 12.5000 mg | ORAL_TABLET | Freq: Three times a day (TID) | ORAL | 1 refills | Status: DC

## 2020-10-02 NOTE — Progress Notes (Signed)
Pt here for Blood pressure check per   Pt currently takes: amLODipine (NORVASC) 10 MG tablet  1 tablet daily  carvedilol (COREG) 12.5 MG tablet  Take 1 tablet (12.5 mg total) by mouth 2 (two) times daily with a meal.  losartan-hydrochlorothiazide (HYZAAR) 100-25 MG tablet  Take 1 tablet by mouth daily  Pt reports compliance with medication.  BP today @ = 132/90 HR = 84  Pt advised per Dr. Abner Greenspan to increase Carvedilol if home Bps were elevated. Pt called. Pt states Bps at home range from 130s/80s to 150s/90s with current medications. Pt advised to increase Carvedilol to three times daily (new Rx sent) and to check Bps at home and to let us know the results. Pt also advised to f/u with Dr. Laury Axon.

## 2020-10-16 ENCOUNTER — Ambulatory Visit: Payer: 59 | Admitting: Family Medicine

## 2020-11-27 ENCOUNTER — Other Ambulatory Visit (HOSPITAL_BASED_OUTPATIENT_CLINIC_OR_DEPARTMENT_OTHER): Payer: Self-pay | Admitting: Family Medicine

## 2020-11-27 DIAGNOSIS — Z1231 Encounter for screening mammogram for malignant neoplasm of breast: Secondary | ICD-10-CM

## 2020-12-04 ENCOUNTER — Ambulatory Visit (HOSPITAL_BASED_OUTPATIENT_CLINIC_OR_DEPARTMENT_OTHER): Payer: 59

## 2020-12-08 ENCOUNTER — Ambulatory Visit (HOSPITAL_BASED_OUTPATIENT_CLINIC_OR_DEPARTMENT_OTHER)
Admission: RE | Admit: 2020-12-08 | Discharge: 2020-12-08 | Disposition: A | Discharge status: Home / Self Care | Payer: 59 | Source: Ambulatory Visit | Attending: Family Medicine | Admitting: Family Medicine

## 2020-12-08 ENCOUNTER — Other Ambulatory Visit: Payer: Self-pay

## 2020-12-08 DIAGNOSIS — Z1231 Encounter for screening mammogram for malignant neoplasm of breast: Secondary | ICD-10-CM | POA: Diagnosis not present

## 2021-01-01 ENCOUNTER — Encounter: Payer: Self-pay | Admitting: Internal Medicine

## 2021-01-01 ENCOUNTER — Ambulatory Visit (INDEPENDENT_AMBULATORY_CARE_PROVIDER_SITE_OTHER): Payer: 59 | Admitting: Internal Medicine

## 2021-01-01 ENCOUNTER — Other Ambulatory Visit: Payer: Self-pay

## 2021-01-01 VITALS — BP 134/82 | HR 70 | Ht 65.0 in | Wt 174.5 lb

## 2021-01-01 DIAGNOSIS — E1165 Type 2 diabetes mellitus with hyperglycemia: Secondary | ICD-10-CM | POA: Diagnosis not present

## 2021-01-01 LAB — POCT GLYCOSYLATED HEMOGLOBIN (HGB A1C): Hemoglobin A1C: 8.3 % — AB (ref 4.0–5.6)

## 2021-01-01 LAB — POCT GLUCOSE (DEVICE FOR HOME USE): POC Glucose: 207 mg/dl — AB (ref 70–99)

## 2021-01-01 NOTE — Patient Instructions (Addendum)
-   Keep up the good Work ! - Continue Metformin 2 tablets Twice a day  - Continue tresiba 22 units daily     HOW TO TREAT LOW BLOOD SUGARS (Blood sugar LESS THAN 70 MG/DL)  Please follow the RULE OF 15 for the treatment of hypoglycemia treatment (when your (blood sugars are less than 70 mg/dL)    STEP 1: Take 15 grams of carbohydrates when your blood sugar is low, which includes:   3-4 GLUCOSE TABS  OR  3-4 OZ OF JUICE OR REGULAR SODA OR  ONE TUBE OF GLUCOSE GEL     STEP 2: RECHECK blood sugar in 15 MINUTES STEP 3: If your blood sugar is still low at the 15 minute recheck --> then, go back to STEP 1 and treat AGAIN with another 15 grams of carbohydrates.

## 2021-01-01 NOTE — Progress Notes (Signed)
Name: Jordan Joseph  Age/ Sex: 65 y.o., female   MRN/ DOB: [id number], 1956-04-11     PCP: Donato Schultz, DO   Reason for Endocrinology Evaluation: Type 2 Diabetes Mellitus  Initial Endocrine Consultative Visit: 03/06/2020    PATIENT IDENTIFIER: Jordan Joseph is a 65 y.o. female with a past medical history of HTN and T2DM. The patient has followed with Endocrinology clinic since 03/06/2020 for consultative assistance with management of her diabetes.  DIABETIC HISTORY:  Jordan Joseph was diagnosed with T2DM in 2013. Marland Kitchen Her hemoglobin A1c has ranged from 11.1% in 04/2020, peaking at 13.5 % in 2021  ON her initial visit she was on basal insulin and Glipizide. We stopped Glipizide, started Metformin and increased basal insulin   SUBJECTIVE:   During the last visit (08/30/2020): A1c 9.2% . We continued Metformin and  basal insulin      Today (01/01/2021): Jordan Joseph is here for a follow up on diabetes management.  She checks her blood sugars 1 times daily, preprandial to breakfast The patient has not  had hypoglycemic episodes since the last clinic visit.   Denies nausea or vomiting     HOME DIABETES REGIMEN:  Metformin 500 mg XR, 2 tablet BID Tresiba 22 units daily      Statin: Yes ACE-I/ARB: Yes     GLUCOSE LOG:  155 - 242 mg/dL    DIABETIC COMPLICATIONS: Microvascular complications:    Denies: CKD, retinopathy, neuropathy   Last Eye Exam: Completed 2020  Macrovascular complications:    Denies: CAD, CVA, PVD   HISTORY:  Past Medical History:  Past Medical History:  Diagnosis Date  . Diabetes mellitus type II, uncontrolled (HCC)   . HTN (hypertension)     Past Surgical History: No past surgical history on file.  Social History:  reports that she has never smoked. She has never used smokeless tobacco. No history on file for alcohol use and drug use. Family History:  Family History  Problem Relation Age of Onset  . Alzheimer's disease Mother   .  Hypertension Father   . Cirrhosis Father   . Diabetes Mellitus II Father   . Diabetes Brother   . Hypertension Brother      HOME MEDICATIONS: Allergies as of 01/01/2021   No Known Allergies     Medication List       Accurate as of January 01, 2021  1:28 PM. If you have any questions, ask your nurse or doctor.        amLODipine 10 MG tablet Commonly known as: NORVASC Take 1 tablet (10 mg total) by mouth daily.   carvedilol 12.5 MG tablet Commonly known as: COREG Take 1 tablet (12.5 mg total) by mouth with breakfast, with lunch, and with evening meal.   losartan-hydrochlorothiazide 100-25 MG tablet Commonly known as: HYZAAR Take 1 tablet by mouth daily.   metFORMIN 500 MG 24 hr tablet Commonly known as: GLUCOPHAGE-XR Take 2 tablets (1,000 mg total) by mouth in the morning and at bedtime.   simvastatin 20 MG tablet Commonly known as: ZOCOR Take 1 tablet (20 mg total) by mouth at bedtime.   [name] FlexTouch 100 UNIT/ML FlexTouch Pen Generic drug: insulin degludec Inject 0.22 mLs (22 Units total) into the skin daily.   UNABLE TO FIND Med Name: Casimiro Needle blood sugar meter        OBJECTIVE:   Vital Signs: BP 134/82   Pulse 70   Ht 5\' 5"  (1.651 m)   Wt 174 lb  8 oz (79.2 kg)   LMP  (LMP Unknown)   SpO2 98%   BMI 29.04 kg/m   Wt Readings from Last 3 Encounters:  01/01/21 174 lb 8 oz (79.2 kg)  09/11/20 177 lb (80.3 kg)  09/04/20 175 lb 4 oz (79.5 kg)     Exam: General: Pt appears well and is in NAD  Lungs: Clear with good BS bilat   Heart: RRR  Extremities: No pretibial edema.   Neuro: MS is good with appropriate affect, pt is alert and Ox3    DM foot exam: 06/05/2020  The skin of the feet is intact without sores or ulcerations. The pedal pulses are 2+ on right and 2+ on left. The sensation is intact to a screening 5.07, 10 gram monofilament bilaterally    DATA REVIEWED:  Lab Results  Component Value Date   HGBA1C 8.3 (A) 01/01/2021   HGBA1C 9.2  (A) 09/04/2020   HGBA1C 11.1 (H) 05/29/2020   Lab Results  Component Value Date   MICROALBUR 8.7 05/29/2020   LDLCALC 81 05/29/2020   CREATININE 1.04 09/11/2020   Lab Results  Component Value Date   MICRALBCREAT 136 (H) 05/29/2020     Lab Results  Component Value Date   CHOL 160 09/11/2020   HDL 34.30 (L) 09/11/2020   LDLCALC 81 05/29/2020   LDLDIRECT 82.0 09/11/2020   TRIG 253.0 (H) 09/11/2020   CHOLHDL 5 09/11/2020         ASSESSMENT / PLAN / RECOMMENDATIONS:   1) Type 2 Diabetes Mellitus, Poorly controlled, Without complications - Most recent A1c of 8.3  %. Goal A1c < 7.0 %.    - A1c down from 9.2 %  - I have praised the pt on lifestyle changes and improving glycemic control  - She has started to exercise, she would like to continue current regimen and not to increase insulin at this time  - She is tolerating Metformin without side effect.      MEDICATIONS:  Continue Metformin 500 mg 2 tabs BID   Continue Tresiba 22 units daily   EDUCATION / INSTRUCTIONS:  BG monitoring instructions: Patient is instructed to check her blood sugars 1 times a day, fasting   Call [facility] if: BG persistently < 70  . I reviewed the Rule of 15 for the treatment of hypoglycemia in detail with the patient. Literature supplied.   2) Diabetic complications:   Eye: Does not have known diabetic retinopathy.   Neuro/ Feet: Does not have known diabetic peripheral neuropathy .   Renal: Patient does not have known baseline CKD. She   is  on an ACEI/ARB at present.     F/U in 4 months    Signed electronically by: Lyndle Herrlich, MD  [facility]  [facility] [address] [place] [place], [place] [address] Phone: [phone] FAX: [phone]   CC: [place] Organ [address] [place] [address] Phone: [phone]  Fax: [phone]  Return to Endocrinology clinic as below: Future  Appointments  Date Time Provider Department Center  03/12/2021  2:40 PM Donato Schultz, DO [id number]

## 2021-03-12 ENCOUNTER — Encounter: Payer: Self-pay | Admitting: Family Medicine

## 2021-03-12 ENCOUNTER — Other Ambulatory Visit: Payer: Self-pay

## 2021-03-12 ENCOUNTER — Ambulatory Visit (INDEPENDENT_AMBULATORY_CARE_PROVIDER_SITE_OTHER): Payer: 59 | Admitting: Family Medicine

## 2021-03-12 VITALS — BP 132/80 | HR 82 | Temp 98.2°F | Resp 18 | Ht 65.0 in | Wt 175.0 lb

## 2021-03-12 DIAGNOSIS — E1165 Type 2 diabetes mellitus with hyperglycemia: Secondary | ICD-10-CM | POA: Diagnosis not present

## 2021-03-12 DIAGNOSIS — I10 Essential (primary) hypertension: Secondary | ICD-10-CM

## 2021-03-12 DIAGNOSIS — E785 Hyperlipidemia, unspecified: Secondary | ICD-10-CM

## 2021-03-12 DIAGNOSIS — E1169 Type 2 diabetes mellitus with other specified complication: Secondary | ICD-10-CM | POA: Diagnosis not present

## 2021-03-12 MED ORDER — CARVEDILOL 12.5 MG PO TABS: 12.5000 mg | ORAL_TABLET | Freq: Three times a day (TID) | ORAL | 1 refills | Status: DC

## 2021-03-12 MED ORDER — SIMVASTATIN 20 MG PO TABS: 20.0000 mg | ORAL_TABLET | Freq: Every day | ORAL | 1 refills | Status: DC

## 2021-03-12 MED ORDER — LOSARTAN POTASSIUM-HCTZ 100-25 MG PO TABS
1.0000 | ORAL_TABLET | Freq: Every day | ORAL | 1 refills | Status: DC
Start: 2021-03-12 — End: 2022-01-10

## 2021-03-12 MED ORDER — METFORMIN HCL ER 500 MG PO TB24: 1000.0000 mg | ORAL_TABLET | Freq: Two times a day (BID) | ORAL | 1 refills | Status: DC

## 2021-03-12 NOTE — Assessment & Plan Note (Signed)
Well controlled, no changes to meds. Encouraged heart healthy diet such as the DASH diet and exercise as tolerated.  °

## 2021-03-12 NOTE — Progress Notes (Signed)
Subjective:   By signing my name below, I, [name], attest that this documentation has been prepared under the direction and in the presence of Dr. Seabron Spates, DO. 03/12/2021    Patient ID: Jordan Joseph, female    DOB: Oct 31, 1955, 65 y.o.   MRN: [id number]  Chief Complaint  Patient presents with   Hyperlipidemia   Follow-up    HPI Patient is in today for a office visit.  She is requesting a refill on 100-25mg  hyzaar daily PO, 500 mg metformin daily PO, 20 mg simvastatin daily PO, 12.5 mg carvedilol 3x daily PO with meals.  Her blood sugars have improved since her last visit. She is improving her diet since her last visit. Her blood sugar ranges from 170- 91. She felt light headed when she first had low blood sugar but quickly adapted and does not have symptoms of light headedness anymore. She sees her endocrinologist to manage her diabetes. Lab Results  Component Value Date   HGBA1C 8.3 (A) 01/01/2021   Her blood pressure is doing well during this visit. BP Readings from Last 3 Encounters:  03/12/21 132/80  01/01/21 134/82  09/11/20 (!) 150/90   She denies having leg swelling at this time. She last ate steak, mushrooms, and salad at 1:30 pm.   Past Medical History:  Diagnosis Date   Diabetes mellitus type II, uncontrolled (HCC)    HTN (hypertension)     History reviewed. No pertinent surgical history.  Family History  Problem Relation Age of Onset   Alzheimer's disease Mother    Hypertension Father    Cirrhosis Father    Diabetes Mellitus II Father    Diabetes Brother    Hypertension Brother     Social History   Socioeconomic History   Marital status: Single    Spouse name: Not on file   Number of children: Not on file   Years of education: Not on file   Highest education level: Not on file  Occupational History   Not on file  Tobacco Use   Smoking status: Never   Smokeless tobacco: Never  Substance and Sexual Activity   Alcohol use: Not  on file   Drug use: Not on file   Sexual activity: Not on file  Other Topics Concern   Not on file  Social History Narrative   Not on file   Social Determinants of Health   Financial Resource Strain: Not on file  Food Insecurity: Not on file  Transportation Needs: Not on file  Physical Activity: Not on file  Stress: Not on file  Social Connections: Not on file  Intimate Partner Violence: Not on file    Outpatient Medications Prior to Visit  Medication Sig Dispense Refill   amLODipine (NORVASC) 10 MG tablet Take 1 tablet (10 mg total) by mouth daily. 90 tablet 1   insulin degludec (TRESIBA FLEXTOUCH) 100 UNIT/ML FlexTouch Pen Inject 0.22 mLs (22 Units total) into the skin daily. 15 mL 6   UNABLE TO FIND Med Name: Casimiro Needle blood sugar meter     carvedilol (COREG) 12.5 MG tablet Take 1 tablet (12.5 mg total) by mouth with breakfast, with lunch, and with evening meal. 270 tablet 1   losartan-hydrochlorothiazide (HYZAAR) 100-25 MG tablet Take 1 tablet by mouth daily. 90 tablet 1   metFORMIN (GLUCOPHAGE-XR) 500 MG 24 hr tablet Take 2 tablets (1,000 mg total) by mouth in the morning and at bedtime. 180 tablet 1   simvastatin (ZOCOR) 20 MG tablet Take  1 tablet (20 mg total) by mouth at bedtime. 90 tablet 1   No facility-administered medications prior to visit.    No Known Allergies  Review of Systems  Constitutional:  Negative for fever and malaise/fatigue.  HENT:  Negative for congestion.   Eyes:  Negative for blurred vision.  Respiratory:  Negative for shortness of breath.   Cardiovascular:  Negative for chest pain, palpitations and leg swelling.  Gastrointestinal:  Negative for abdominal pain, blood in stool and nausea.  Genitourinary:  Negative for dysuria and frequency.  Musculoskeletal:  Negative for falls.  Skin:  Negative for rash.  Neurological:  Negative for dizziness, loss of consciousness and headaches.  Endo/Heme/Allergies:  Negative for environmental allergies.   Psychiatric/Behavioral:  Negative for depression. The patient is not nervous/anxious.       Objective:    Physical Exam Vitals and nursing note reviewed.  Constitutional:      General: She is not in acute distress.    Appearance: Normal appearance. She is not ill-appearing.  HENT:     Head: Normocephalic and atraumatic.     Right Ear: External ear normal.     Left Ear: External ear normal.  Eyes:     Extraocular Movements: Extraocular movements intact.     Pupils: Pupils are equal, round, and reactive to light.  Cardiovascular:     Rate and Rhythm: Normal rate and regular rhythm.     Pulses: Normal pulses.     Heart sounds: Normal heart sounds. No murmur heard.   No gallop.  Pulmonary:     Effort: Pulmonary effort is normal. No respiratory distress.     Breath sounds: Normal breath sounds. No wheezing, rhonchi or rales.  Skin:    General: Skin is warm and dry.  Neurological:     Mental Status: She is alert and oriented to person, place, and time.  Psychiatric:        Behavior: Behavior normal.    BP 132/80 (BP Location: Right Arm, Patient Position: Sitting, Cuff Size: Large)   Pulse 82   Temp 98.2 F (36.8 C) (Oral)   Resp 18   Ht 5\' 5"  (1.651 m)   Wt 175 lb (79.4 kg)   LMP  (LMP Unknown)   SpO2 97%   BMI 29.12 kg/m  Wt Readings from Last 3 Encounters:  03/12/21 175 lb (79.4 kg)  01/01/21 174 lb 8 oz (79.2 kg)  09/11/20 177 lb (80.3 kg)    Diabetic Foot Exam - Simple   No data filed    Lab Results  Component Value Date   WBC 8.0 02/17/2020   HGB 13.6 02/17/2020   HCT 40.3 02/17/2020   PLT 261.0 02/17/2020   GLUCOSE 330 (H) 09/11/2020   CHOL 160 09/11/2020   TRIG 253.0 (H) 09/11/2020   HDL 34.30 (L) 09/11/2020   LDLDIRECT 82.0 09/11/2020   LDLCALC 81 05/29/2020   ALT 16 09/11/2020   AST 12 09/11/2020   NA 135 09/11/2020   K 3.8 09/11/2020   CL 98 09/11/2020   CREATININE 1.04 09/11/2020   BUN 14 09/11/2020   CO2 30 09/11/2020   TSH 2.31  02/17/2020   HGBA1C 8.3 (A) 01/01/2021   MICROALBUR 8.7 05/29/2020    Lab Results  Component Value Date   TSH 2.31 02/17/2020   Lab Results  Component Value Date   WBC 8.0 02/17/2020   HGB 13.6 02/17/2020   HCT 40.3 02/17/2020   MCV 95.4 02/17/2020   PLT 261.0 02/17/2020  Lab Results  Component Value Date   NA 135 09/11/2020   K 3.8 09/11/2020   CO2 30 09/11/2020   GLUCOSE 330 (H) 09/11/2020   BUN 14 09/11/2020   CREATININE 1.04 09/11/2020   BILITOT 0.5 09/11/2020   ALKPHOS 57 09/11/2020   AST 12 09/11/2020   ALT 16 09/11/2020   PROT 7.0 09/11/2020   ALBUMIN 4.1 09/11/2020   CALCIUM 9.5 09/11/2020   GFR 57.08 (L) 09/11/2020   Lab Results  Component Value Date   CHOL 160 09/11/2020   Lab Results  Component Value Date   HDL 34.30 (L) 09/11/2020   Lab Results  Component Value Date   LDLCALC 81 05/29/2020   Lab Results  Component Value Date   TRIG 253.0 (H) 09/11/2020   Lab Results  Component Value Date   CHOLHDL 5 09/11/2020   Lab Results  Component Value Date   HGBA1C 8.3 (A) 01/01/2021       Assessment & Plan:   Problem List Items Addressed This Visit       Unprioritized   Hyperlipidemia associated with type 2 diabetes mellitus (HCC) - Primary    Encourage heart healthy diet such as MIND or DASH diet, increase exercise, avoid trans fats, simple carbohydrates and processed foods, consider a krill or fish or flaxseed oil cap daily.        Relevant Medications   losartan-hydrochlorothiazide (HYZAAR) 100-25 MG tablet   metFORMIN (GLUCOPHAGE-XR) 500 MG 24 hr tablet   simvastatin (ZOCOR) 20 MG tablet   Other Relevant Orders   CBC with Differential/Platelet   Comprehensive metabolic panel   Lipid panel   Hypertension    Well controlled, no changes to meds. Encouraged heart healthy diet such as the DASH diet and exercise as tolerated.        Relevant Medications   losartan-hydrochlorothiazide (HYZAAR) 100-25 MG tablet   simvastatin (ZOCOR)  20 MG tablet   carvedilol (COREG) 12.5 MG tablet   Other Relevant Orders   CBC with Differential/Platelet   Comprehensive metabolic panel   Lipid panel   Uncontrolled type 2 diabetes mellitus with hyperglycemia (HCC)    hgba1c to be checked , minimize simple carbs. Increase exercise as tolerated. Continue current meds        Relevant Medications   losartan-hydrochlorothiazide (HYZAAR) 100-25 MG tablet   metFORMIN (GLUCOPHAGE-XR) 500 MG 24 hr tablet   simvastatin (ZOCOR) 20 MG tablet   Other Visit Diagnoses     Essential hypertension       Relevant Medications   losartan-hydrochlorothiazide (HYZAAR) 100-25 MG tablet   simvastatin (ZOCOR) 20 MG tablet   carvedilol (COREG) 12.5 MG tablet   Other Relevant Orders   CBC with Differential/Platelet   Type 2 diabetes mellitus with hyperglycemia, without long-term current use of insulin (HCC)       Relevant Medications   losartan-hydrochlorothiazide (HYZAAR) 100-25 MG tablet   metFORMIN (GLUCOPHAGE-XR) 500 MG 24 hr tablet   simvastatin (ZOCOR) 20 MG tablet        Meds ordered this encounter  Medications   losartan-hydrochlorothiazide (HYZAAR) 100-25 MG tablet    Sig: Take 1 tablet by mouth daily.    Dispense:  90 tablet    Refill:  1   metFORMIN (GLUCOPHAGE-XR) 500 MG 24 hr tablet    Sig: Take 2 tablets (1,000 mg total) by mouth in the morning and at bedtime.    Dispense:  180 tablet    Refill:  1   simvastatin (ZOCOR) 20 MG tablet  Sig: Take 1 tablet (20 mg total) by mouth at bedtime.    Dispense:  90 tablet    Refill:  1   carvedilol (COREG) 12.5 MG tablet    Sig: Take 1 tablet (12.5 mg total) by mouth with breakfast, with lunch, and with evening meal.    Dispense:  270 tablet    Refill:  1    I, Dr. Seabron SpatesLowne-Chase, Bettye Sitton, DO, personally preformed the services described in this documentation.  All medical record entries made by the scribe were at my direction and in my presence.  I have reviewed the chart and discharge  instructions (if applicable) and agree that the record reflects my personal performance and is accurate and complete. 03/12/2021   I,[name],acting as a Neurosurgeonscribe for Fisher ScientificYvonne R Lowne Chase, DO.,have documented all relevant documentation on the behalf of Donato SchultzYvonne R Lowne Chase, DO,as directed by  Donato SchultzYvonne R Lowne Chase, DO while in the presence of Donato SchultzYvonne R Lowne Chase, DO.   Donato SchultzYvonne R Lowne Chase, DO

## 2021-03-12 NOTE — Assessment & Plan Note (Signed)
hgba1c to be checked, minimize simple carbs. Increase exercise as tolerated. Continue current meds  

## 2021-03-12 NOTE — Patient Instructions (Signed)
https://www.nhlbi.nih.gov/files/docs/public/heart/dash_brief.pdf">  DASH Eating Plan DASH stands for Dietary Approaches to Stop Hypertension. The DASH eating plan is a healthy eating plan that has been shown to: Reduce high blood pressure (hypertension). Reduce your risk for type 2 diabetes, heart disease, and stroke. Help with weight loss. What are tips for following this plan? Reading food labels Check food labels for the amount of salt (sodium) per serving. Choose foods with less than 5 percent of the Daily Value of sodium. Generally, foods with less than 300 milligrams (mg) of sodium per serving fit into this eating plan. To find whole grains, look for the word "whole" as the first word in the ingredient list. Shopping Buy products labeled as "low-sodium" or "no salt added." Buy fresh foods. Avoid canned foods and pre-made or frozen meals. Cooking Avoid adding salt when cooking. Use salt-free seasonings or herbs instead of table salt or sea salt. Check with your health care provider or pharmacist before using salt substitutes. Do not fry foods. Cook foods using healthy methods such as baking, boiling, grilling, roasting, and broiling instead. Cook with heart-healthy oils, such as olive, canola, avocado, soybean, or sunflower oil. Meal planning  Eat a balanced diet that includes: 4 or more servings of fruits and 4 or more servings of vegetables each day. Try to fill one-half of your plate with fruits and vegetables. 6-8 servings of whole grains each day. Less than 6 oz (170 g) of lean meat, poultry, or fish each day. A 3-oz (85-g) serving of meat is about the same size as a deck of cards. One egg equals 1 oz (28 g). 2-3 servings of low-fat dairy each day. One serving is 1 cup (237 mL). 1 serving of nuts, seeds, or beans 5 times each week. 2-3 servings of heart-healthy fats. Healthy fats called omega-3 fatty acids are found in foods such as walnuts, flaxseeds, fortified milks, and eggs.  These fats are also found in cold-water fish, such as sardines, salmon, and mackerel. Limit how much you eat of: Canned or prepackaged foods. Food that is high in trans fat, such as some fried foods. Food that is high in saturated fat, such as fatty meat. Desserts and other sweets, sugary drinks, and other foods with added sugar. Full-fat dairy products. Do not salt foods before eating. Do not eat more than 4 egg yolks a week. Try to eat at least 2 vegetarian meals a week. Eat more home-cooked food and less restaurant, buffet, and fast food.  Lifestyle When eating at a restaurant, ask that your food be prepared with less salt or no salt, if possible. If you drink alcohol: Limit how much you use to: 0-1 drink a day for women who are not pregnant. 0-2 drinks a day for men. Be aware of how much alcohol is in your drink. In the [place] one drink equals one 12 oz bottle of beer (355 mL), one 5 oz glass of wine (148 mL), or one 1 oz glass of hard liquor (44 mL). General information Avoid eating more than 2,300 mg of salt a day. If you have hypertension, you may need to reduce your sodium intake to 1,500 mg a day. Work with your health care provider to maintain a healthy body weight or to lose weight. Ask what an ideal weight is for you. Get at least 30 minutes of exercise that causes your heart to beat faster (aerobic exercise) most days of the week. Activities may include walking, swimming, or biking. Work with your health care provider   or dietitian to adjust your eating plan to your individual calorie needs. What foods should I eat? Fruits All fresh, dried, or frozen fruit. Canned fruit in natural juice (without addedsugar). Vegetables Fresh or frozen vegetables (raw, steamed, roasted, or grilled). Low-sodium or reduced-sodium tomato and vegetable juice. Low-sodium or reduced-sodium tomatosauce and tomato paste. Low-sodium or reduced-sodium canned vegetables. Grains Whole-grain or  whole-wheat bread. Whole-grain or whole-wheat pasta. Brown rice. Oatmeal. Quinoa. Bulgur. Whole-grain and low-sodium cereals. Pita bread.Low-fat, low-sodium crackers. Whole-wheat flour tortillas. Meats and other proteins Skinless chicken or turkey. Ground chicken or turkey. Pork with fat trimmed off. Fish and seafood. Egg whites. Dried beans, peas, or lentils. Unsalted nuts, nut butters, and seeds. Unsalted canned beans. Lean cuts of beef with fat trimmed off. Low-sodium, lean precooked or cured meat, such as sausages or meatloaves. Dairy Low-fat (1%) or fat-free (skim) milk. Reduced-fat, low-fat, or fat-free cheeses. Nonfat, low-sodium ricotta or cottage cheese. Low-fat or nonfatyogurt. Low-fat, low-sodium cheese. Fats and oils Soft margarine without trans fats. Vegetable oil. Reduced-fat, low-fat, or light mayonnaise and salad dressings (reduced-sodium). Canola, safflower, olive, avocado, soybean, andsunflower oils. Avocado. Seasonings and condiments Herbs. Spices. Seasoning mixes without salt. Other foods Unsalted popcorn and pretzels. Fat-free sweets. The items listed above may not be a complete list of foods and beverages you can eat. Contact a dietitian for more information. What foods should I avoid? Fruits Canned fruit in a light or heavy syrup. Fried fruit. Fruit in cream or buttersauce. Vegetables Creamed or fried vegetables. Vegetables in a cheese sauce. Regular canned vegetables (not low-sodium or reduced-sodium). Regular canned tomato sauce and paste (not low-sodium or reduced-sodium). Regular tomato and vegetable juice(not low-sodium or reduced-sodium). Pickles. Olives. Grains Baked goods made with fat, such as croissants, muffins, or some breads. Drypasta or rice meal packs. Meats and other proteins Fatty cuts of meat. Ribs. Fried meat. Bacon. Bologna, salami, and other precooked or cured meats, such as sausages or meat loaves. Fat from the back of a pig (fatback). Bratwurst.  Salted nuts and seeds. Canned beans with added salt. Canned orsmoked fish. Whole eggs or egg yolks. Chicken or turkey with skin. Dairy Whole or 2% milk, cream, and half-and-half. Whole or full-fat cream cheese. Whole-fat or sweetened yogurt. Full-fat cheese. Nondairy creamers. Whippedtoppings. Processed cheese and cheese spreads. Fats and oils Butter. Stick margarine. Lard. Shortening. Ghee. Bacon fat. Tropical oils, suchas coconut, palm kernel, or palm oil. Seasonings and condiments Onion salt, garlic salt, seasoned salt, table salt, and sea salt. Worcestershire sauce. Tartar sauce. Barbecue sauce. Teriyaki sauce. Soy sauce, including reduced-sodium. Steak sauce. Canned and packaged gravies. Fish sauce. Oyster sauce. Cocktail sauce. Store-bought horseradish. Ketchup. Mustard. Meat flavorings and tenderizers. Bouillon cubes. Hot sauces. Pre-made or packaged marinades. Pre-made or packaged taco seasonings. Relishes. Regular saladdressings. Other foods Salted popcorn and pretzels. The items listed above may not be a complete list of foods and beverages you should avoid. Contact a dietitian for more information. Where to find more information National Heart, Lung, and Blood Institute: www.nhlbi.nih.gov American Heart Association: www.heart.org Academy of Nutrition and Dietetics: www.eatright.org National Kidney Foundation: www.kidney.org Summary The DASH eating plan is a healthy eating plan that has been shown to reduce high blood pressure (hypertension). It may also reduce your risk for type 2 diabetes, heart disease, and stroke. When on the DASH eating plan, aim to eat more fresh fruits and vegetables, whole grains, lean proteins, low-fat dairy, and heart-healthy fats. With the DASH eating plan, you should limit salt (sodium) intake to 2,300   mg a day. If you have hypertension, you may need to reduce your sodium intake to 1,500 mg a day. Work with your health care provider or dietitian to adjust  your eating plan to your individual calorie needs. This information is not intended to replace advice given to you by your health care provider. Make sure you discuss any questions you have with your healthcare provider. Document Revised: 08/19/2019 Document Reviewed: 08/19/2019 Elsevier Patient Education  2022 Elsevier Inc.  

## 2021-03-12 NOTE — Assessment & Plan Note (Signed)
Encourage heart healthy diet such as MIND or DASH diet, increase exercise, avoid trans fats, simple carbohydrates and processed foods, consider a krill or fish or flaxseed oil cap daily.  °

## 2021-04-02 ENCOUNTER — Other Ambulatory Visit: Payer: Self-pay | Admitting: Family Medicine

## 2021-04-02 ENCOUNTER — Other Ambulatory Visit (INDEPENDENT_AMBULATORY_CARE_PROVIDER_SITE_OTHER): Payer: 59

## 2021-04-02 ENCOUNTER — Other Ambulatory Visit: Payer: Self-pay

## 2021-04-02 DIAGNOSIS — E785 Hyperlipidemia, unspecified: Secondary | ICD-10-CM | POA: Diagnosis not present

## 2021-04-02 DIAGNOSIS — E1169 Type 2 diabetes mellitus with other specified complication: Secondary | ICD-10-CM

## 2021-04-02 DIAGNOSIS — I10 Essential (primary) hypertension: Secondary | ICD-10-CM

## 2021-04-02 LAB — CBC WITH DIFFERENTIAL/PLATELET
Basophils Absolute: 0 10*3/uL (ref 0.0–0.1)
Basophils Relative: 0.5 % (ref 0.0–3.0)
Eosinophils Absolute: 0.2 10*3/uL (ref 0.0–0.7)
Eosinophils Relative: 3.1 % (ref 0.0–5.0)
HCT: 36.6 % (ref 36.0–46.0)
Hemoglobin: 12.4 g/dL (ref 12.0–15.0)
Lymphocytes Relative: 30.8 % (ref 12.0–46.0)
Lymphs Abs: 1.8 10*3/uL (ref 0.7–4.0)
MCHC: 33.9 g/dL (ref 30.0–36.0)
MCV: 93.3 fl (ref 78.0–100.0)
Monocytes Absolute: 0.4 10*3/uL (ref 0.1–1.0)
Monocytes Relative: 6.7 % (ref 3.0–12.0)
Neutro Abs: 3.4 10*3/uL (ref 1.4–7.7)
Neutrophils Relative %: 58.9 % (ref 43.0–77.0)
Platelets: 273 10*3/uL (ref 150.0–400.0)
RBC: 3.93 Mil/uL (ref 3.87–5.11)
RDW: 13.5 % (ref 11.5–15.5)
WBC: 5.7 10*3/uL (ref 4.0–10.5)

## 2021-04-02 LAB — LIPID PANEL
Cholesterol: 173 mg/dL (ref 0–200)
HDL: 38.8 mg/dL — ABNORMAL LOW (ref >39.00)
LDL Cholesterol: 96 mg/dL (ref 0–99)
NonHDL: 134.36
Total CHOL/HDL Ratio: 4
Triglycerides: 192 mg/dL — ABNORMAL HIGH (ref 0.0–149.0)
VLDL: 38.4 mg/dL (ref 0.0–40.0)

## 2021-04-02 LAB — COMPREHENSIVE METABOLIC PANEL
ALT: 16 U/L (ref 0–35)
AST: 13 U/L (ref 0–37)
Albumin: 4.1 g/dL (ref 3.5–5.2)
Alkaline Phosphatase: 61 U/L (ref 39–117)
BUN: 15 mg/dL (ref 6–23)
CO2: 29 mEq/L (ref 19–32)
Calcium: 9.8 mg/dL (ref 8.4–10.5)
Chloride: 99 mEq/L (ref 96–112)
Creatinine, Ser: 0.81 mg/dL (ref 0.40–1.20)
GFR: 76.75 mL/min (ref >60.00)
Glucose, Bld: 292 mg/dL — ABNORMAL HIGH (ref 70–99)
Potassium: 3.8 mEq/L (ref 3.5–5.1)
Sodium: 135 mEq/L (ref 135–145)
Total Bilirubin: 0.6 mg/dL (ref 0.2–1.2)
Total Protein: 7 g/dL (ref 6.0–8.3)

## 2021-04-02 NOTE — Addendum Note (Signed)
Addended by: Mervin Kung A on: 04/02/2021 01:17 PM   Modules accepted: Orders

## 2021-04-03 NOTE — Progress Notes (Signed)
Pt called. LVM to return call 

## 2021-05-14 ENCOUNTER — Ambulatory Visit: Payer: 59 | Admitting: Internal Medicine

## 2021-05-18 IMAGING — MG MM DIGITAL SCREENING BILAT W/ TOMO AND CAD
8 series · 8 of 24 positions shown · non-contrast
Comparison: Previous exam(s).

CLINICAL DATA: Screening.

EXAM:
DIGITAL SCREENING BILATERAL MAMMOGRAM WITH TOMOSYNTHESIS AND CAD
TECHNIQUE: Bilateral screening digital craniocaudal and mediolateral oblique
mammograms were obtained. Bilateral screening digital breast
tomosynthesis was performed. The images were evaluated with
computer-aided detection.

[L CC synth-2D]
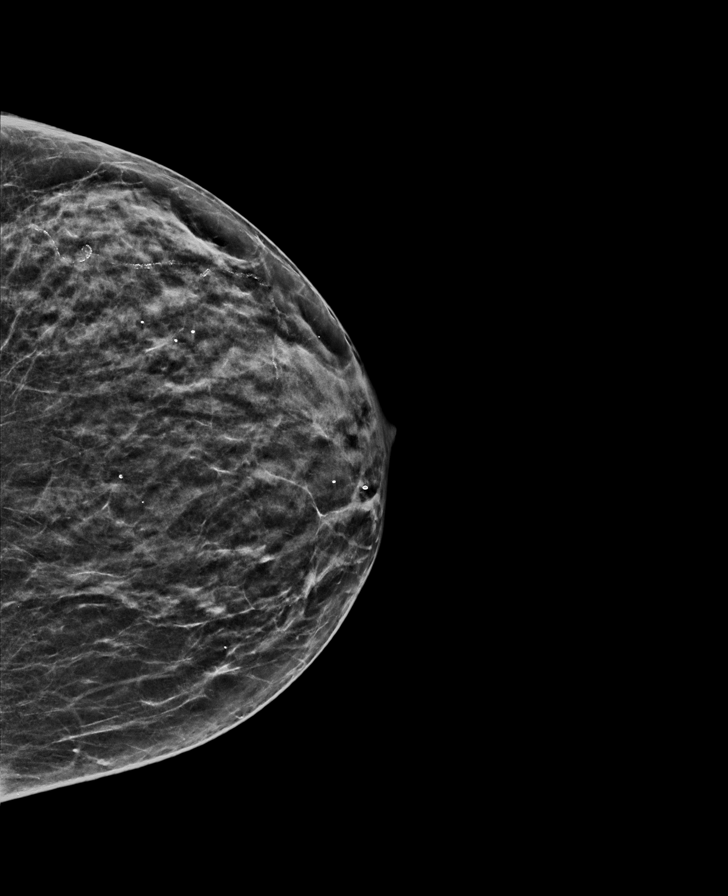

[L MLO synth-2D]
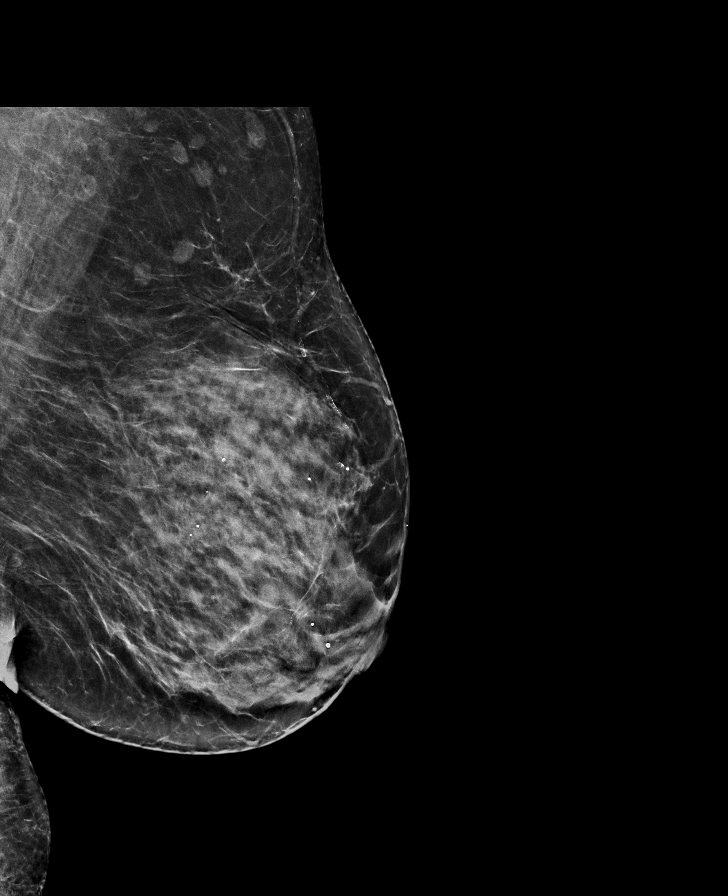

[R MLO synth-2D]
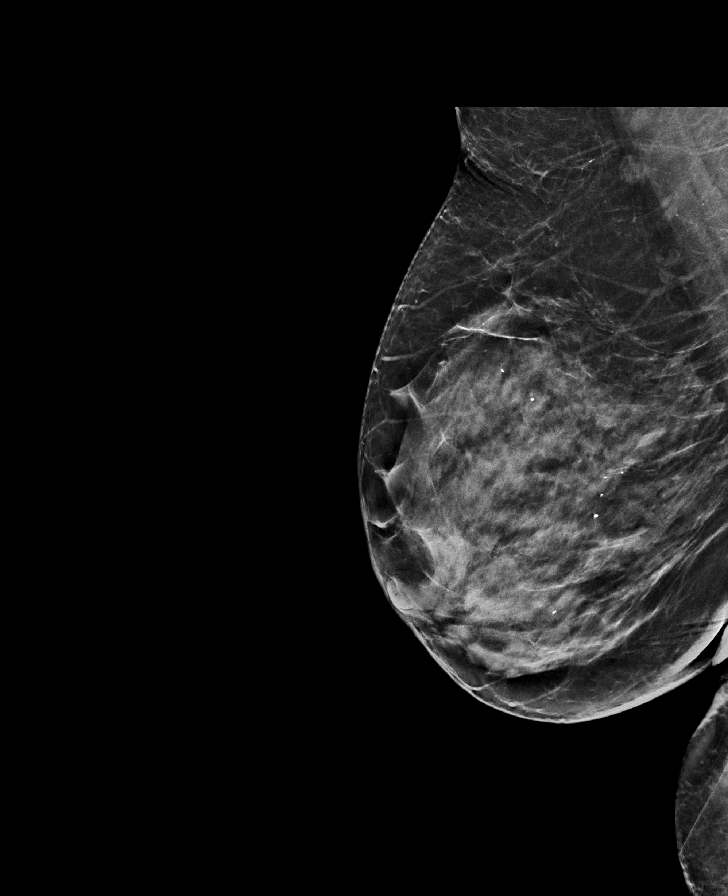

[R CC synth-2D]
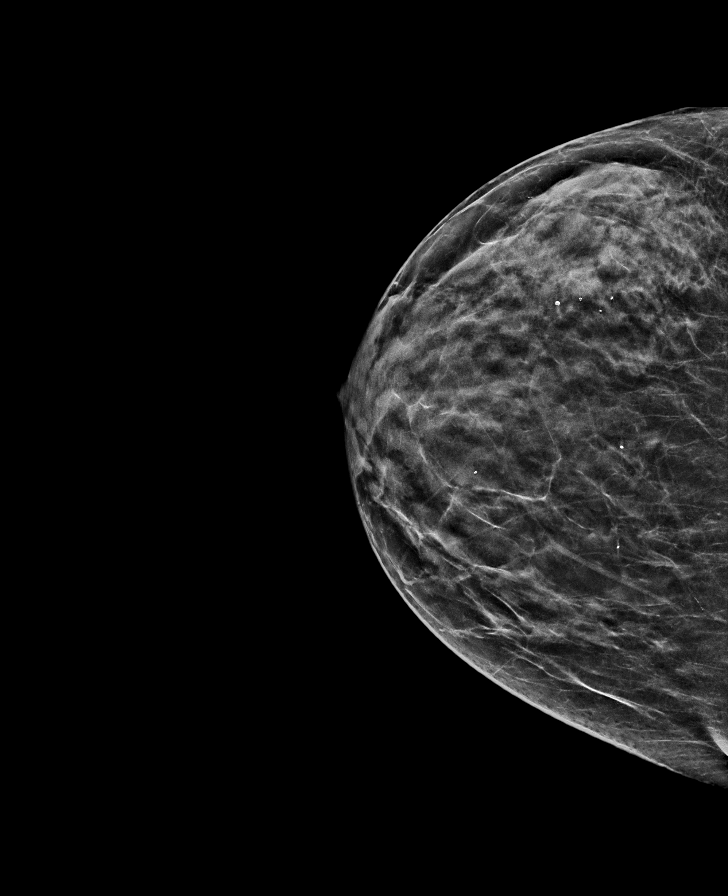

[R MLO tomo · tomo slice 33/66.0]
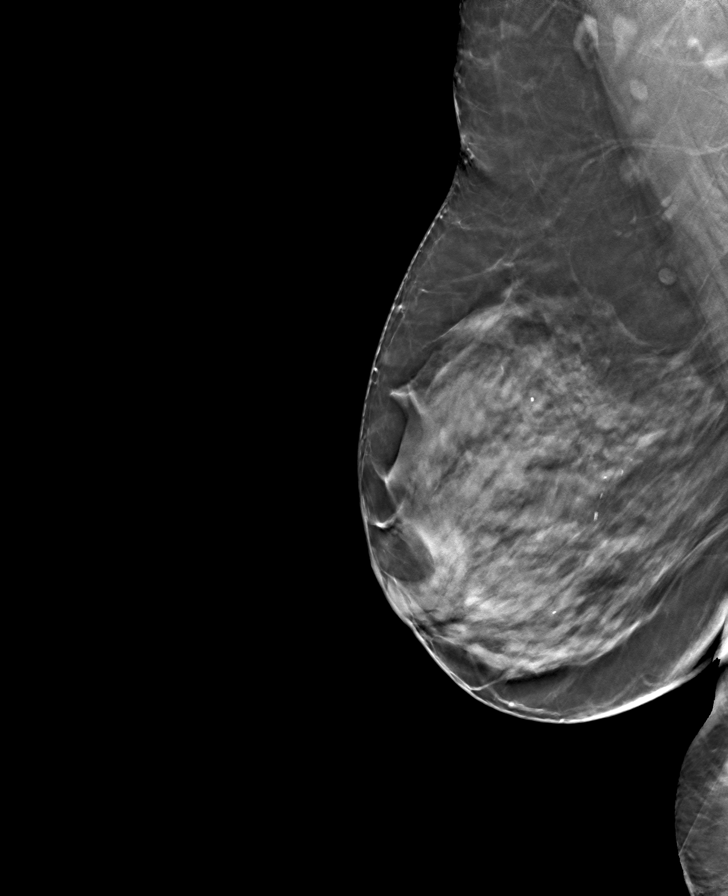

[L MLO tomo · tomo slice 36/71.0]
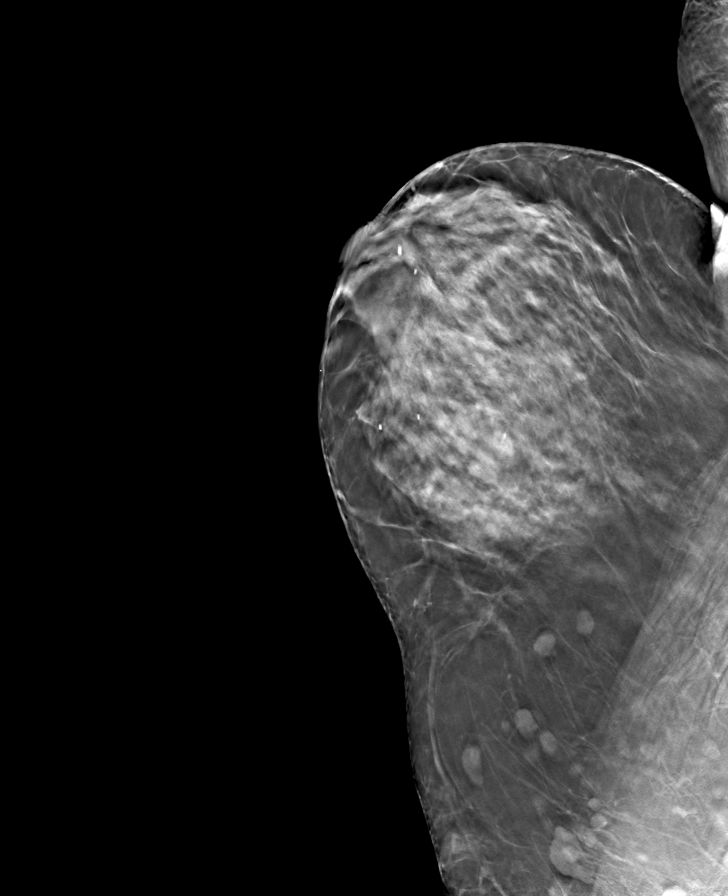

[R CC tomo · tomo slice 27/54.0]
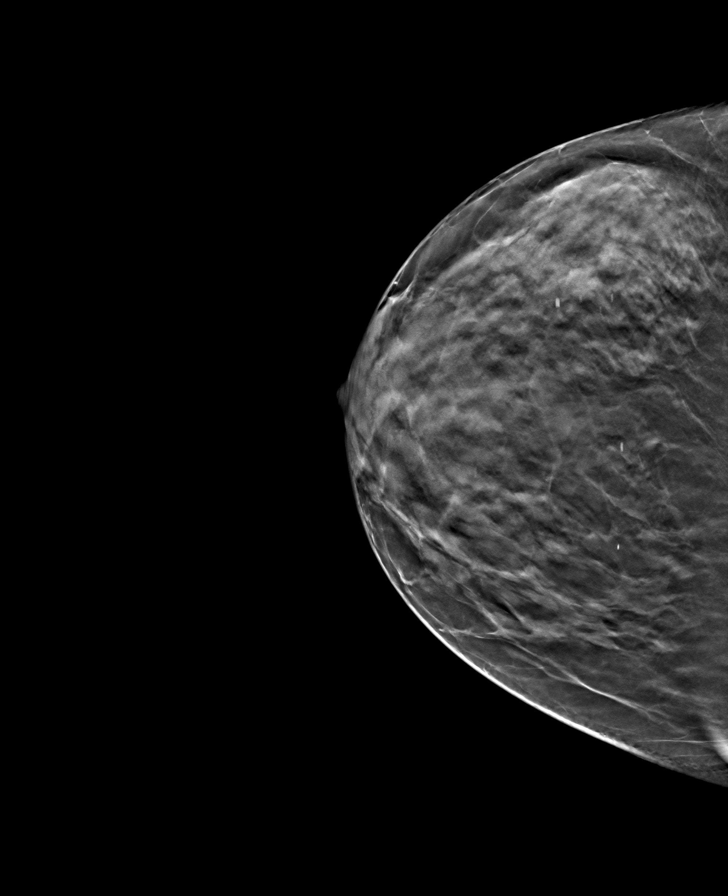

[L CC tomo · tomo slice 27/53.0]
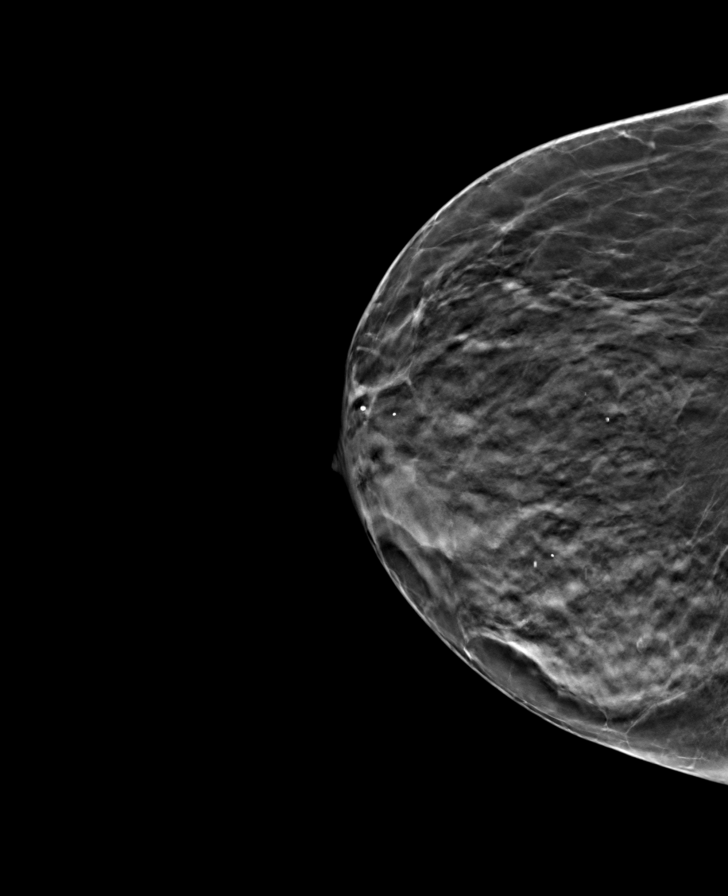

[8 of 24 positions shown; findings below may reference images not displayed]

ACR Breast Density Category c: The breast tissue is heterogeneously
dense, which may obscure small masses.
FINDINGS: There are no findings suspicious for malignancy. The images were
evaluated with computer-aided detection.
IMPRESSION: No mammographic evidence of malignancy. A result letter of this
screening mammogram will be mailed directly to the patient.

RECOMMENDATION:
Screening mammogram in one year. (Code:T4-5-GWO)

BI-RADS CATEGORY  1: Negative.

## 2021-06-13 ENCOUNTER — Other Ambulatory Visit: Payer: Self-pay | Admitting: Family Medicine

## 2021-06-13 DIAGNOSIS — I10 Essential (primary) hypertension: Secondary | ICD-10-CM

## 2021-06-21 ENCOUNTER — Encounter: Payer: Self-pay | Admitting: Family Medicine

## 2021-06-21 ENCOUNTER — Other Ambulatory Visit: Payer: Self-pay

## 2021-06-21 ENCOUNTER — Ambulatory Visit (INDEPENDENT_AMBULATORY_CARE_PROVIDER_SITE_OTHER): Payer: 59 | Admitting: Family Medicine

## 2021-06-21 VITALS — BP 160/100 | HR 84 | Temp 99.0°F | Resp 18 | Ht 65.0 in | Wt 179.2 lb

## 2021-06-21 DIAGNOSIS — E1169 Type 2 diabetes mellitus with other specified complication: Secondary | ICD-10-CM

## 2021-06-21 DIAGNOSIS — Z1211 Encounter for screening for malignant neoplasm of colon: Secondary | ICD-10-CM | POA: Diagnosis not present

## 2021-06-21 DIAGNOSIS — I10 Essential (primary) hypertension: Secondary | ICD-10-CM | POA: Diagnosis not present

## 2021-06-21 DIAGNOSIS — E785 Hyperlipidemia, unspecified: Secondary | ICD-10-CM

## 2021-06-21 DIAGNOSIS — Z1159 Encounter for screening for other viral diseases: Secondary | ICD-10-CM

## 2021-06-21 LAB — MICROALBUMIN / CREATININE URINE RATIO
Creatinine,U: 58.8 mg/dL
Microalb Creat Ratio: 48.8 mg/g — ABNORMAL HIGH (ref 0.0–30.0)
Microalb, Ur: 28.7 mg/dL — ABNORMAL HIGH (ref 0.0–1.9)

## 2021-06-21 NOTE — Assessment & Plan Note (Signed)
Poorly controlled will alter medications, encouraged DASH diet, minimize caffeine and obtain adequate sleep. Report concerning symptoms and follow up as directed and as needed Pt has not been taking losartan con't norvasc and coreg Refill losartan  F/u 2-3 weeks

## 2021-06-21 NOTE — Assessment & Plan Note (Signed)
Encourage heart healthy diet such as MIND or DASH diet, increase exercise, avoid trans fats, simple carbohydrates and processed foods, consider a krill or fish or flaxseed oil cap daily.  °

## 2021-06-21 NOTE — Patient Instructions (Signed)

## 2021-06-21 NOTE — Progress Notes (Signed)
Subjective:   By signing my name below, I, [name], attest that this documentation has been prepared under the direction and in the presence of Donato Schultz, DO. 06/21/2021   Patient ID: Jordan Joseph, female    DOB: 1956-05-07, 65 y.o.   MRN: [id number]  Chief Complaint  Patient presents with   Hypertension    Pt states bps have been 178/103 and the lowest has been 144/86. Pt states fatigue but no other sxs.    Follow-up    HPI Patient is in today for an office visit.  She reports that she has been having high blood pressure readings for the past 3 weeks and was worried. She has not been using 100-25 mg losartan since June 2022 because she was not given the refill at her pharmacy. BP Readings from Last 3 Encounters:  06/21/21 (!) 160/100  03/12/21 132/80  01/01/21 134/82     She denies chest pain and headaches.  She is UTD on vision care. There were no problems when she saw him in March 2022 and has a 6 month f/u.  She is UTD on the flu vaccine. She has 2 Development worker, international aid vaccines. She is willing to get the pneumonia and shingles vaccine but wants to wait till her blood pressure becomes stable.  Past Medical History:  Diagnosis Date   Diabetes mellitus type II, uncontrolled (HCC)    HTN (hypertension)     No past surgical history on file.  Family History  Problem Relation Age of Onset   Alzheimer's disease Mother    Hypertension Father    Cirrhosis Father    Diabetes Mellitus II Father    Diabetes Brother    Hypertension Brother     Social History   Socioeconomic History   Marital status: Single    Spouse name: Not on file   Number of children: Not on file   Years of education: Not on file   Highest education level: Not on file  Occupational History   Not on file  Tobacco Use   Smoking status: Never   Smokeless tobacco: Never  Substance and Sexual Activity   Alcohol use: Not on file   Drug use: Not on file   Sexual activity: Not on file   Other Topics Concern   Not on file  Social History Narrative   Not on file   Social Determinants of Health   Financial Resource Strain: Not on file  Food Insecurity: Not on file  Transportation Needs: Not on file  Physical Activity: Not on file  Stress: Not on file  Social Connections: Not on file  Intimate Partner Violence: Not on file    Outpatient Medications Prior to Visit  Medication Sig Dispense Refill   amLODipine (NORVASC) 10 MG tablet Take 1 tablet (10 mg total) by mouth daily. 90 tablet 0   carvedilol (COREG) 12.5 MG tablet Take 1 tablet (12.5 mg total) by mouth with breakfast, with lunch, and with evening meal. 270 tablet 1   insulin degludec (TRESIBA FLEXTOUCH) 100 UNIT/ML FlexTouch Pen Inject 0.22 mLs (22 Units total) into the skin daily. 15 mL 6   losartan-hydrochlorothiazide (HYZAAR) 100-25 MG tablet Take 1 tablet by mouth daily. 90 tablet 1   metFORMIN (GLUCOPHAGE-XR) 500 MG 24 hr tablet Take 2 tablets (1,000 mg total) by mouth in the morning and at bedtime. 180 tablet 1   simvastatin (ZOCOR) 20 MG tablet Take 1 tablet (20 mg total) by mouth at bedtime. 90 tablet  1   UNABLE TO FIND Med Name: Casimiro Needle blood sugar meter     No facility-administered medications prior to visit.    No Known Allergies  Review of Systems  Constitutional:  Negative for fever.  HENT:  Negative for congestion, ear pain, hearing loss, sinus pain and sore throat.   Eyes:  Negative for blurred vision and pain.  Respiratory:  Negative for cough, sputum production, shortness of breath and wheezing.   Cardiovascular:  Negative for chest pain and palpitations.  Gastrointestinal:  Negative for blood in stool, constipation, diarrhea, nausea and vomiting.  Genitourinary:  Negative for dysuria, frequency, hematuria and urgency.  Musculoskeletal:  Negative for back pain, falls and myalgias.  Neurological:  Negative for dizziness, sensory change, loss of consciousness, weakness and headaches.   Endo/Heme/Allergies:  Negative for environmental allergies. Does not bruise/bleed easily.  Psychiatric/Behavioral:  Negative for depression and suicidal ideas. The patient is not nervous/anxious and does not have insomnia.       Objective:    Physical Exam Constitutional:      General: She is not in acute distress.    Appearance: Normal appearance. She is not ill-appearing.  HENT:     Head: Normocephalic and atraumatic.     Right Ear: External ear normal.     Left Ear: External ear normal.  Eyes:     Extraocular Movements: Extraocular movements intact.     Pupils: Pupils are equal, round, and reactive to light.  Cardiovascular:     Rate and Rhythm: Normal rate and regular rhythm.     Pulses: Normal pulses.     Heart sounds: Normal heart sounds. No murmur heard.   No gallop.  Pulmonary:     Effort: Pulmonary effort is normal. No respiratory distress.     Breath sounds: Normal breath sounds. No wheezing, rhonchi or rales.  Abdominal:     General: Bowel sounds are normal. There is no distension.     Palpations: Abdomen is soft. There is no mass.     Tenderness: There is no abdominal tenderness. There is no guarding or rebound.     Hernia: No hernia is present.  Musculoskeletal:     Cervical back: Normal range of motion and neck supple.  Lymphadenopathy:     Cervical: No cervical adenopathy.  Skin:    General: Skin is warm and dry.  Neurological:     Mental Status: She is alert and oriented to person, place, and time.  Psychiatric:        Behavior: Behavior normal.    BP (!) 160/100 (BP Location: Right Arm, Patient Position: Sitting, Cuff Size: Normal)   Pulse 84   Temp 99 F (37.2 C) (Oral)   Resp 18   Ht 5\' 5"  (1.651 m)   Wt 179 lb 3.2 oz (81.3 kg)   LMP  (LMP Unknown)   SpO2 97%   BMI 29.82 kg/m  Wt Readings from Last 3 Encounters:  06/21/21 179 lb 3.2 oz (81.3 kg)  03/12/21 175 lb (79.4 kg)  01/01/21 174 lb 8 oz (79.2 kg)    Diabetic Foot Exam - Simple    No data filed    Lab Results  Component Value Date   WBC 5.7 04/02/2021   HGB 12.4 04/02/2021   HCT 36.6 04/02/2021   PLT 273.0 04/02/2021   GLUCOSE 292 (H) 04/02/2021   CHOL 173 04/02/2021   TRIG 192.0 (H) 04/02/2021   HDL 38.80 (L) 04/02/2021   LDLDIRECT 82.0 09/11/2020   LDLCALC  96 04/02/2021   ALT 16 04/02/2021   AST 13 04/02/2021   NA 135 04/02/2021   K 3.8 04/02/2021   CL 99 04/02/2021   CREATININE 0.81 04/02/2021   BUN 15 04/02/2021   CO2 29 04/02/2021   TSH 2.31 02/17/2020   HGBA1C 8.3 (A) 01/01/2021   MICROALBUR 8.7 05/29/2020    Lab Results  Component Value Date   TSH 2.31 02/17/2020   Lab Results  Component Value Date   WBC 5.7 04/02/2021   HGB 12.4 04/02/2021   HCT 36.6 04/02/2021   MCV 93.3 04/02/2021   PLT 273.0 04/02/2021   Lab Results  Component Value Date   NA 135 04/02/2021   K 3.8 04/02/2021   CO2 29 04/02/2021   GLUCOSE 292 (H) 04/02/2021   BUN 15 04/02/2021   CREATININE 0.81 04/02/2021   BILITOT 0.6 04/02/2021   ALKPHOS 61 04/02/2021   AST 13 04/02/2021   ALT 16 04/02/2021   PROT 7.0 04/02/2021   ALBUMIN 4.1 04/02/2021   CALCIUM 9.8 04/02/2021   GFR 76.75 04/02/2021   Lab Results  Component Value Date   CHOL 173 04/02/2021   Lab Results  Component Value Date   HDL 38.80 (L) 04/02/2021   Lab Results  Component Value Date   LDLCALC 96 04/02/2021   Lab Results  Component Value Date   TRIG 192.0 (H) 04/02/2021   Lab Results  Component Value Date   CHOLHDL 4 04/02/2021   Lab Results  Component Value Date   HGBA1C 8.3 (A) 01/01/2021       Assessment & Plan:   Problem List Items Addressed This Visit       Unprioritized   Hyperlipidemia associated with type 2 diabetes mellitus (HCC)    Encourage heart healthy diet such as MIND or DASH diet, increase exercise, avoid trans fats, simple carbohydrates and processed foods, consider a krill or fish or flaxseed oil cap daily.       Hypertension - Primary    Poorly  controlled will alter medications, encouraged DASH diet, minimize caffeine and obtain adequate sleep. Report concerning symptoms and follow up as directed and as needed Pt has not been taking losartan con't norvasc and coreg Refill losartan  F/u 2-3 weeks       Relevant Orders   Microalbumin / creatinine urine ratio   Comprehensive metabolic panel   Lipid panel   Other Visit Diagnoses     Hyperlipidemia, unspecified hyperlipidemia type       Relevant Orders   Microalbumin / creatinine urine ratio   Comprehensive metabolic panel   Lipid panel   Need for hepatitis C screening test       Relevant Orders   Hepatitis C antibody   Colon cancer screening       Relevant Orders   Ambulatory referral to Gastroenterology        No orders of the defined types were placed in this encounter.   I,[name],acting as a Neurosurgeon for [employer], DO.,have documented all relevant documentation on the behalf of Donato Schultz, DO,as directed by  Donato Schultz, DO while in the presence of Donato Schultz, DO.   I, Donato Schultz, DO., personally preformed the services described in this documentation.  All medical record entries made by the scribe were at my direction and in my presence.  I have reviewed the chart and discharge instructions (if applicable) and agree that the record reflects my  personal performance and is accurate and complete. 06/21/2021

## 2021-06-25 ENCOUNTER — Ambulatory Visit: Payer: 59 | Admitting: Internal Medicine

## 2021-06-25 ENCOUNTER — Other Ambulatory Visit: Payer: Self-pay

## 2021-06-25 ENCOUNTER — Encounter: Payer: Self-pay | Admitting: Internal Medicine

## 2021-06-25 VITALS — BP 146/74 | HR 74 | Ht 65.0 in | Wt 177.8 lb

## 2021-06-25 DIAGNOSIS — E1165 Type 2 diabetes mellitus with hyperglycemia: Secondary | ICD-10-CM | POA: Diagnosis not present

## 2021-06-25 LAB — POCT GLYCOSYLATED HEMOGLOBIN (HGB A1C): Hemoglobin A1C: 10 % — AB (ref 4.0–5.6)

## 2021-06-25 LAB — GLUCOSE, POCT (MANUAL RESULT ENTRY): POC Glucose: 200 mg/dl — AB (ref 70–99)

## 2021-06-25 MED ORDER — FREESTYLE LITE TEST VI STRP: 1.0000 | ORAL_STRIP | Freq: Every day | 3 refills | Status: DC

## 2021-06-25 MED ORDER — TRESIBA FLEXTOUCH 100 UNIT/ML ~~LOC~~ SOPN: 26.0000 [IU] | PEN_INJECTOR | Freq: Every day | SUBCUTANEOUS | 11 refills | Status: DC

## 2021-06-25 MED ORDER — DAPAGLIFLOZIN PROPANEDIOL 5 MG PO TABS: 5.0000 mg | ORAL_TABLET | Freq: Every day | ORAL | 6 refills | Status: DC

## 2021-06-25 MED ORDER — METFORMIN HCL ER 500 MG PO TB24: 1000.0000 mg | ORAL_TABLET | Freq: Two times a day (BID) | ORAL | 3 refills | Status: DC

## 2021-06-25 NOTE — Patient Instructions (Addendum)
-   Start Farxiga 5 mg, 1 tablet daily  - Continue Metformin 2 tablets Twice a day  - Increase tresiba 26 units daily    HOW TO TREAT LOW BLOOD SUGARS (Blood sugar LESS THAN 70 MG/DL) Please follow the RULE OF 15 for the treatment of hypoglycemia treatment (when your (blood sugars are less than 70 mg/dL)   STEP 1: Take 15 grams of carbohydrates when your blood sugar is low, which includes:  3-4 GLUCOSE TABS  OR 3-4 OZ OF JUICE OR REGULAR SODA OR ONE TUBE OF GLUCOSE GEL    STEP 2: RECHECK blood sugar in 15 MINUTES STEP 3: If your blood sugar is still low at the 15 minute recheck --> then, go back to STEP 1 and treat AGAIN with another 15 grams of carbohydrates.

## 2021-06-25 NOTE — Progress Notes (Signed)
Name: Kimari Lienhard  Age/ Sex: 65 y.o., female   MRN/ DOB: [id number], 19-Dec-1955     PCP: Donato Schultz, DO   Reason for Endocrinology Evaluation: Type 2 Diabetes Mellitus  Initial Endocrine Consultative Visit: 03/06/2020    PATIENT IDENTIFIER: Ms. Cyncere Ruhe is a 65 y.o. female with a past medical history of HTN and T2DM. The patient has followed with Endocrinology clinic since 03/06/2020 for consultative assistance with management of her diabetes.  DIABETIC HISTORY:  Ms. Lovan was diagnosed with T2DM in 2013. Marland Kitchen Her hemoglobin A1c has ranged from 11.1% in 04/2020, peaking at 13.5 % in 2021  ON her initial visit she was on basal insulin and Glipizide. We stopped Glipizide, started Metformin and increased basal insulin   SUBJECTIVE:   During the last visit (01/01/2021): A1c 8.3 % . We continued Metformin and  basal insulin      Today (06/25/2021): Ms. Apodaca is here for a follow up on diabetes management.  She checks her blood sugars 0 times a day , her meter broke.   Denies nausea or vomiting   She admits to skipping on Metformin at night   HOME DIABETES REGIMEN:  Metformin 500 mg XR, 2 tablet BID Tresiba 22 units daily      Statin: Yes ACE-I/ARB: Yes     GLUCOSE LOG:  155 - 242 mg/dL    DIABETIC COMPLICATIONS: Microvascular complications:   Denies: CKD, retinopathy, neuropathy  Last Eye Exam: Completed 2020  Macrovascular complications:   Denies: CAD, CVA, PVD   HISTORY:  Past Medical History:  Past Medical History:  Diagnosis Date  . Diabetes mellitus type II, uncontrolled (HCC)   . HTN (hypertension)    Past Surgical History: No past surgical history on file. Social History:  reports that she has never smoked. She has never used smokeless tobacco. No history on file for alcohol use and drug use. Family History:  Family History  Problem Relation Age of Onset  . Alzheimer's disease Mother   . Hypertension Father   . Cirrhosis Father   .  Diabetes Mellitus II Father   . Diabetes Brother   . Hypertension Brother      HOME MEDICATIONS: Allergies as of 06/25/2021   No Known Allergies      Medication List        Accurate as of June 25, 2021  7:30 AM. If you have any questions, ask your nurse or doctor.          amLODipine 10 MG tablet Commonly known as: NORVASC Take 1 tablet (10 mg total) by mouth daily.   carvedilol 12.5 MG tablet Commonly known as: COREG Take 1 tablet (12.5 mg total) by mouth with breakfast, with lunch, and with evening meal.   losartan-hydrochlorothiazide 100-25 MG tablet Commonly known as: HYZAAR Take 1 tablet by mouth daily.   metFORMIN 500 MG 24 hr tablet Commonly known as: GLUCOPHAGE-XR Take 2 tablets (1,000 mg total) by mouth in the morning and at bedtime.   simvastatin 20 MG tablet Commonly known as: ZOCOR Take 1 tablet (20 mg total) by mouth at bedtime.   [name] FlexTouch 100 UNIT/ML FlexTouch Pen Generic drug: insulin degludec Inject 0.22 mLs (22 Units total) into the skin daily.   UNABLE TO FIND Med Name: Casimiro Needle blood sugar meter         OBJECTIVE:   Vital Signs: LMP  (LMP Unknown)   Wt Readings from Last 3 Encounters:  06/21/21 179 lb 3.2 oz (81.3 kg)  03/12/21 175 lb (79.4 kg)  01/01/21 174 lb 8 oz (79.2 kg)     Exam: General: Pt appears well and is in NAD  Lungs: Clear with good BS bilat   Heart: RRR  Extremities: No pretibial edema.   Neuro: MS is good with appropriate affect, pt is alert and Ox3    DM foot exam: 06/05/2020  The skin of the feet is intact without sores or ulcerations. The pedal pulses are 2+ on right and 2+ on left. The sensation is intact to a screening 5.07, 10 gram monofilament bilaterally    DATA REVIEWED:  Lab Results  Component Value Date   HGBA1C 8.3 (A) 01/01/2021   HGBA1C 9.2 (A) 09/04/2020   HGBA1C 11.1 (H) 05/29/2020   Lab Results  Component Value Date   MICROALBUR 28.7 (H) 06/21/2021   LDLCALC 96  04/02/2021   CREATININE 0.81 04/02/2021   Lab Results  Component Value Date   MICRALBCREAT 48.8 (H) 06/21/2021     Lab Results  Component Value Date   CHOL 173 04/02/2021   HDL 38.80 (L) 04/02/2021   LDLCALC 96 04/02/2021   LDLDIRECT 82.0 09/11/2020   TRIG 192.0 (H) 04/02/2021   CHOLHDL 4 04/02/2021         ASSESSMENT / PLAN / RECOMMENDATIONS:   1) Type 2 Diabetes Mellitus, Poorly controlled, Without complications - Most recent A1c of 10.0 %. Goal A1c < 7.0 %.    - A1c up  from 8.3 % . Pt admits to dietary indiscretions  - Discussed adding SGLT-2 inhibitors, concerned about cost. Coupon provided  - Will increase insulin as below  - Counseled about the importance of low carb diet and importance of glycemic control in preventing microvascular complications      MEDICATIONS: Continue Metformin 500 mg 2 tabs BID  Increase Tresiba 26 units daily  Start Farxiga 5 mg , 1 tablet daily   EDUCATION / INSTRUCTIONS: BG monitoring instructions: Patient is instructed to check her blood sugars 1 times a day, fasting  Call [facility] if: BG persistently < 70  I reviewed the Rule of 15 for the treatment of hypoglycemia in detail with the patient. Literature supplied.   2) Diabetic complications:  Eye: Does not have known diabetic retinopathy.  Neuro/ Feet: Does not have known diabetic peripheral neuropathy .  Renal: Patient does not have known baseline CKD. She   is  on an ACEI/ARB at present.     F/U in 4 months    Signed electronically by: Lyndle Herrlich, MD  [facility]  [facility] [address] [place], [place] [address] Phone: [phone] FAX: [phone]   CC: [facility] [address] [place] [address] Phone: [phone]  Fax: [phone]  Return to Endocrinology clinic as below: Future Appointments  Date Time Provider Department Center  06/25/2021  2:20 PM  Lillion Elbert, Konrad Dolores, MD [facility]  07/11/2021 10:30 AM [facility] NURSE [facility]  09/12/2021  1:00 PM Donato Schultz, DO [facility]

## 2021-06-27 ENCOUNTER — Telehealth: Payer: Self-pay | Admitting: Family Medicine

## 2021-06-27 NOTE — Telephone Encounter (Signed)
Patient is requesting for a referral to OBGYN Here at med center. To Jordan Joseph.

## 2021-07-03 ENCOUNTER — Other Ambulatory Visit: Payer: Self-pay

## 2021-07-03 DIAGNOSIS — R809 Proteinuria, unspecified: Secondary | ICD-10-CM

## 2021-07-10 ENCOUNTER — Encounter: Payer: Self-pay | Admitting: Gastroenterology

## 2021-07-11 ENCOUNTER — Ambulatory Visit (INDEPENDENT_AMBULATORY_CARE_PROVIDER_SITE_OTHER): Payer: 59 | Admitting: Family Medicine

## 2021-07-11 ENCOUNTER — Other Ambulatory Visit: Payer: Self-pay

## 2021-07-11 DIAGNOSIS — I10 Essential (primary) hypertension: Secondary | ICD-10-CM | POA: Diagnosis not present

## 2021-07-11 NOTE — Progress Notes (Addendum)
BP Readings from Last 3 Encounters:  06/25/21 (!) 146/74  06/21/21 (!) 160/100  03/12/21 132/80    Patient here for BP check.   BP today is 140/82    with a pulse of 76. Per Dr. Suan Halter- Chase ok to continue same medications and follow up as scheduled in December.

## 2021-07-12 ENCOUNTER — Ambulatory Visit: Payer: 59

## 2021-08-16 ENCOUNTER — Other Ambulatory Visit: Payer: Self-pay

## 2021-08-16 ENCOUNTER — Encounter: Payer: Self-pay | Admitting: Gastroenterology

## 2021-08-16 ENCOUNTER — Ambulatory Visit (INDEPENDENT_AMBULATORY_CARE_PROVIDER_SITE_OTHER): Payer: 59 | Admitting: Gastroenterology

## 2021-08-16 VITALS — BP 140/98 | HR 82 | Ht 65.0 in | Wt 177.0 lb

## 2021-08-16 DIAGNOSIS — Z1211 Encounter for screening for malignant neoplasm of colon: Secondary | ICD-10-CM

## 2021-08-16 DIAGNOSIS — Z1212 Encounter for screening for malignant neoplasm of rectum: Secondary | ICD-10-CM

## 2021-08-16 NOTE — Progress Notes (Addendum)
Chief Complaint:   Referring Provider:  Zola Joseph, Jordan Joseph, *      ASSESSMENT AND PLAN;   #1. Colorectal cancer screening. Neg Colon Jan 2015 (Dr Jordan Joseph, rpt in 10 yrs)   Plan: -Recall Colon Jan 2025 -We have given her all contact numbers.  She is to call us in case of any GI problems until then.   HPI:    Jordan Joseph is a 65 y.o. female   Sent for colorectal cancer screening  We got last colonoscopy report from Dr Jordan Joseph office dated October 27, 2013 -Good prep -Left-sided diverticulosis. -Repeat colonoscopy in 10 years. -Sent for scanning.  Patient without any GI problems.  No family history of colon polyps or colon cancer.  She would like to wait until Jan 2025  SH-pastor in church Past Medical History:  Diagnosis Date   Diabetes mellitus type II, uncontrolled    HTN (hypertension)     Past Surgical History:  Procedure Laterality Date   CHOLECYSTECTOMY     COLONOSCOPY  10/27/2013   Jordan Joseph. [name], MD. left sided diverticulosis. O/W normar screening colonoscopy.    Family History  Problem Relation Age of Onset   Alzheimer's disease Mother    Hypertension Father    Cirrhosis Father    Diabetes Mellitus II Father    Diabetes Brother    Hypertension Brother    Colon cancer Neg Hx    Rectal cancer Neg Hx    Pancreatic cancer Neg Hx    Stomach cancer Neg Hx    Esophageal cancer Neg Hx    Liver cancer Neg Hx     Social History   Tobacco Use   Smoking status: Never   Smokeless tobacco: Never  Vaping Use   Vaping Use: Never used  Substance Use Topics   Alcohol use: Never   Drug use: Never    Current Outpatient Medications  Medication Sig Dispense Refill   amLODipine (NORVASC) 10 MG tablet Take 1 tablet (10 mg total) by mouth daily. 90 tablet 0   carvedilol (COREG) 12.5 MG tablet Take 1 tablet (12.5 mg total) by mouth with breakfast, with lunch, and with evening meal. 270 tablet 1   dapagliflozin propanediol (FARXIGA) 5 MG TABS tablet  Take 1 tablet (5 mg total) by mouth daily before breakfast. 30 tablet 6   glucose blood (FREESTYLE LITE) test strip 1 each by Other route daily in the afternoon. Use as instructed 90 each 3   insulin degludec (TRESIBA FLEXTOUCH) 100 UNIT/ML FlexTouch Pen Inject 26 Units into the skin daily. 15 mL 11   losartan-hydrochlorothiazide (HYZAAR) 100-25 MG tablet Take 1 tablet by mouth daily. 90 tablet 1   metFORMIN (GLUCOPHAGE-XR) 500 MG 24 hr tablet Take 2 tablets (1,000 mg total) by mouth in the morning and at bedtime. 180 tablet 3   simvastatin (ZOCOR) 20 MG tablet Take 1 tablet (20 mg total) by mouth at bedtime. 90 tablet 1   UNABLE TO FIND Med Name: Jordan Joseph blood sugar meter     No current facility-administered medications for this visit.    No Known Allergies  Review of Systems:  Psychiatric/Behavioral: Situational anxiety d/t being pastor     Physical Exam:    BP (!) 140/98   Pulse 82   Ht 5\' 5"  (1.651 m)   Wt 177 lb (80.3 kg)   LMP  (LMP Unknown)   SpO2 98%   BMI 29.45 kg/m  Wt Readings from Last 3 Encounters:  08/16/21 177  lb (80.3 kg)  06/25/21 177 lb 12.8 oz (80.6 kg)  06/21/21 179 lb 3.2 oz (81.3 kg)   Not examined  Data Reviewed: I have personally reviewed following labs and imaging studies  CBC: CBC Latest Ref Rng & Units 04/02/2021 02/17/2020  WBC 4.0 - 10.5 K/uL 5.7 8.0  Hemoglobin 12.0 - 15.0 g/dL 12.4 13.6  Hematocrit 36.0 - 46.0 % 36.6 40.3  Platelets 150.0 - 400.0 K/uL 273.0 261.0    CMP: CMP Latest Ref Rng & Units 04/02/2021 09/11/2020 05/29/2020  Glucose 70 - 99 mg/dL 292(H) 330(H) 245(H)  BUN 6 - 23 mg/dL 15 14 13   Creatinine 0.40 - 1.20 mg/dL 0.81 1.04 0.75  Sodium 135 - 145 mEq/L 135 135 137  Potassium 3.5 - 5.1 mEq/L 3.8 3.8 3.7  Chloride 96 - 112 mEq/L 99 98 101  CO2 19 - 32 mEq/L 29 30 28   Calcium 8.4 - 10.5 mg/dL 9.8 9.5 10.0  Total Protein 6.0 - 8.3 g/dL 7.0 7.0 7.0  Total Bilirubin 0.2 - 1.2 mg/dL 0.6 0.5 0.7  Alkaline Phos 39 - 117 U/L 61 57 -   AST 0 - 37 U/L 13 12 13   ALT 0 - 35 U/L 16 16 19        Carmell Austria, MD 08/16/2021, 11:15 AM  Cc: Carollee Joseph, Jordan Joseph, *

## 2021-08-16 NOTE — Patient Instructions (Signed)
If you are age 64 or older, your body mass index should be between 23-30. Your Body mass index is 29.45 kg/m. If this is out of the aforementioned range listed, please consider follow up with your Primary Care Provider.  If you are age 82 or younger, your body mass index should be between 19-25. Your Body mass index is 29.45 kg/m. If this is out of the aformentioned range listed, please consider follow up with your Primary Care Provider.   ________________________________________________________  The [facility] providers would like to encourage you to use [facility] to communicate with providers for non-urgent requests or questions.  Due to long hold times on the telephone, sending your provider a message by [facility] may be a faster and more efficient way to get a response.  Please allow 48 business hours for a response.  Please remember that this is for non-urgent requests.  _______________________________________________________  Repeat Colon Jan 2025  Call with any questions.  Thank you,  Dr. Lynann Bologna

## 2021-09-12 ENCOUNTER — Encounter: Payer: 59 | Admitting: Family Medicine

## 2021-09-17 ENCOUNTER — Other Ambulatory Visit: Payer: Self-pay | Admitting: Family Medicine

## 2021-09-17 DIAGNOSIS — I10 Essential (primary) hypertension: Secondary | ICD-10-CM

## 2021-10-11 ENCOUNTER — Encounter: Payer: 59 | Admitting: Family Medicine

## 2021-10-18 ENCOUNTER — Ambulatory Visit (INDEPENDENT_AMBULATORY_CARE_PROVIDER_SITE_OTHER): Payer: 59 | Admitting: Family Medicine

## 2021-10-18 ENCOUNTER — Encounter: Payer: Self-pay | Admitting: Family Medicine

## 2021-10-18 VITALS — BP 146/100 | HR 81 | Temp 98.8°F | Resp 18 | Ht 65.0 in | Wt 175.0 lb

## 2021-10-18 DIAGNOSIS — Z1159 Encounter for screening for other viral diseases: Secondary | ICD-10-CM

## 2021-10-18 DIAGNOSIS — E2839 Other primary ovarian failure: Secondary | ICD-10-CM

## 2021-10-18 DIAGNOSIS — E1165 Type 2 diabetes mellitus with hyperglycemia: Secondary | ICD-10-CM

## 2021-10-18 DIAGNOSIS — I10 Essential (primary) hypertension: Secondary | ICD-10-CM | POA: Diagnosis not present

## 2021-10-18 DIAGNOSIS — E785 Hyperlipidemia, unspecified: Secondary | ICD-10-CM

## 2021-10-18 DIAGNOSIS — Z0000 Encounter for general adult medical examination without abnormal findings: Secondary | ICD-10-CM | POA: Diagnosis not present

## 2021-10-18 DIAGNOSIS — E1169 Type 2 diabetes mellitus with other specified complication: Secondary | ICD-10-CM | POA: Diagnosis not present

## 2021-10-18 DIAGNOSIS — Z23 Encounter for immunization: Secondary | ICD-10-CM | POA: Diagnosis not present

## 2021-10-18 MED ORDER — CARVEDILOL 25 MG PO TABS: 25.0000 mg | ORAL_TABLET | Freq: Two times a day (BID) | ORAL | 3 refills | Status: DC

## 2021-10-18 NOTE — Assessment & Plan Note (Signed)
hgba1c to be checked, minimize simple carbs. Increase exercise as tolerated. Continue current meds  

## 2021-10-18 NOTE — Assessment & Plan Note (Signed)
Encourage heart healthy diet such as MIND or DASH diet, increase exercise, avoid trans fats, simple carbohydrates and processed foods, consider a krill or fish or flaxseed oil cap daily.  °

## 2021-10-18 NOTE — Patient Instructions (Signed)

## 2021-10-18 NOTE — Assessment & Plan Note (Signed)
ghm utd Check labs  See avs  

## 2021-10-18 NOTE — Progress Notes (Signed)
Subjective:   By signing my name below, I, Jordan Joseph, attest that this documentation has been prepared under the direction and in the presence of Jordan Held, DO. 10/18/2021      Patient ID: Jordan Joseph, female    DOB: Sep 17, 1956, 66 y.o.   MRN: [id number]  Chief Complaint  Patient presents with   Annual Exam    HPI Patient is in today for a comprehensive physical exam.  She is trying to reduce her blood pressure using something other than medication. 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 She adds that she has been eating a lot of "comfort food" to deal with the stress.   Her blood pressure is high at this visit. BP Readings from Last 3 Encounters:  10/18/21 (!) 146/100  08/16/21 (!) 140/98  06/25/21 (!) 146/74     She has started exercising by walking 5000 steps and 15 minutes on a trampoline and gazelle bike. She is also trying to start eating healthy.   She denies fever, hearing loss, ear pain,congestion, sinus pain, sore throat, eye pain, chest pain, palpitations, cough, shortness of breath, wheezing, nausea. vomiting, diarrhea, constipation, blood in stool, dysuria,frequency, hematuria and headaches.   She is UTD on vision and dental care.  She has 5 Covid-19 vaccines at this time. She will receive the tetanus vaccine today.   No changes in family medical history.   Past Medical History:  Diagnosis Date   Diabetes mellitus type II, uncontrolled    HTN (hypertension)     Past Surgical History:  Procedure Laterality Date   CHOLECYSTECTOMY     COLONOSCOPY  10/27/2013   Wynona Neat. [name], MD. left sided diverticulosis. O/W normar screening colonoscopy.    Family History  Problem Relation Age of Onset   Alzheimer's disease Mother    Hypertension Father    Cirrhosis Father    Diabetes Mellitus II Father    Diabetes Brother    Hypertension Brother    Colon cancer Neg Hx    Rectal cancer  Neg Hx    Pancreatic cancer Neg Hx    Stomach cancer Neg Hx    Esophageal cancer Neg Hx    Liver cancer Neg Hx     Social History   Socioeconomic History   Marital status: Divorced    Spouse name: Not on file   Number of children: 2   Years of education: Not on file   Highest education level: Not on file  Occupational History   Occupation: Curator  Tobacco Use   Smoking status: Never   Smokeless tobacco: Never  Vaping Use   Vaping Use: Never used  Substance and Sexual Activity   Alcohol use: Never   Drug use: Never   Sexual activity: Not on file  Other Topics Concern   Not on file  Social History Narrative   Exercise -- walking 5000 steps    15 min trampoline   15 min gazelle    Social Determinants of Health   Financial Resource Strain: Not on file  Food Insecurity: Not on file  Transportation Needs: Not on file  Physical Activity: Not on file  Stress: Not on file  Social Connections: Not on file  Intimate Partner Violence: Not on file    Outpatient Medications Prior to Visit  Medication Sig Dispense Refill   amLODipine (NORVASC) 10 MG tablet Take 1 tablet by mouth once daily  90 tablet 1   dapagliflozin propanediol (FARXIGA) 5 MG TABS tablet Take 1 tablet (5 mg total) by mouth daily before breakfast. 30 tablet 6   glucose blood (FREESTYLE LITE) test strip 1 each by Other route daily in the afternoon. Use as instructed 90 each 3   insulin degludec (TRESIBA FLEXTOUCH) 100 UNIT/ML FlexTouch Pen Inject 26 Units into the skin daily. 15 mL 11   losartan-hydrochlorothiazide (HYZAAR) 100-25 MG tablet Take 1 tablet by mouth daily. 90 tablet 1   metFORMIN (GLUCOPHAGE-XR) 500 MG 24 hr tablet Take 2 tablets (1,000 mg total) by mouth in the morning and at bedtime. 180 tablet 3   simvastatin (ZOCOR) 20 MG tablet Take 1 tablet (20 mg total) by mouth at bedtime. 90 tablet 1   UNABLE TO FIND Med Name: Kevan Rosebush blood sugar meter     carvedilol (COREG) 12.5 MG tablet Take 1  tablet (12.5 mg total) by mouth with breakfast, with lunch, and with evening meal. 270 tablet 1   No facility-administered medications prior to visit.    No Known Allergies  Review of Systems  Constitutional:  Negative for fever.  HENT:  Negative for congestion, ear pain, hearing loss, sinus pain and sore throat.   Eyes:  Negative for blurred vision and pain.  Respiratory:  Negative for cough, sputum production, shortness of breath and wheezing.   Cardiovascular:  Negative for chest pain and palpitations.  Gastrointestinal:  Negative for blood in stool, constipation, diarrhea, nausea and vomiting.  Genitourinary:  Negative for dysuria, frequency, hematuria and urgency.  Musculoskeletal:  Negative for back pain, falls and myalgias.  Neurological:  Negative for dizziness, sensory change, loss of consciousness, weakness and headaches.  Endo/Heme/Allergies:  Negative for environmental allergies. Does not bruise/bleed easily.  Psychiatric/Behavioral:  Negative for depression and suicidal ideas. The patient is not nervous/anxious and does not have insomnia.       Objective:    Physical Exam Constitutional:      General: She is not in acute distress.    Appearance: She is well-developed.  HENT:     Head: Normocephalic and atraumatic.     Right Ear: Tympanic membrane, ear canal and external ear normal.     Left Ear: Tympanic membrane, ear canal and external ear normal.  Eyes:     Conjunctiva/sclera: Conjunctivae normal.  Neck:     Thyroid: No thyromegaly.  Cardiovascular:     Rate and Rhythm: Normal rate and regular rhythm.     Heart sounds: Normal heart sounds. No murmur heard. Pulmonary:     Effort: Pulmonary effort is normal. No respiratory distress.     Breath sounds: Normal breath sounds.  Chest:  Breasts:    Right: Normal.     Left: Normal.  Abdominal:     General: Bowel sounds are normal. There is no distension.     Palpations: Abdomen is soft. There is no mass.      Tenderness: There is no abdominal tenderness.  Musculoskeletal:     Cervical back: Neck supple.  Lymphadenopathy:     Cervical: No cervical adenopathy.  Skin:    General: Skin is warm and dry.  Neurological:     Mental Status: She is alert and oriented to person, place, and time.  Psychiatric:        Behavior: Behavior normal.    BP (!) 146/100 (BP Location: Right Arm, Patient Position: Sitting, Cuff Size: Normal)    Pulse 81    Temp 98.8 F (37.1 C) (Oral)  Resp 18    Ht 5\' 5"  (1.651 m)    Wt 175 lb (79.4 kg)    LMP  (LMP Unknown)    SpO2 95%    BMI 29.12 kg/m  Wt Readings from Last 3 Encounters:  10/18/21 175 lb (79.4 kg)  08/16/21 177 lb (80.3 kg)  06/25/21 177 lb 12.8 oz (80.6 kg)    Diabetic Foot Exam - Simple   No data filed    Lab Results  Component Value Date   WBC 5.7 04/02/2021   HGB 12.4 04/02/2021   HCT 36.6 04/02/2021   PLT 273.0 04/02/2021   GLUCOSE 292 (H) 04/02/2021   CHOL 173 04/02/2021   TRIG 192.0 (H) 04/02/2021   HDL 38.80 (L) 04/02/2021   LDLDIRECT 82.0 09/11/2020   LDLCALC 96 04/02/2021   ALT 16 04/02/2021   AST 13 04/02/2021   NA 135 04/02/2021   K 3.8 04/02/2021   CL 99 04/02/2021   CREATININE 0.81 04/02/2021   BUN 15 04/02/2021   CO2 29 04/02/2021   TSH 2.31 02/17/2020   HGBA1C 10.0 (A) 06/25/2021   MICROALBUR 28.7 (H) 06/21/2021    Lab Results  Component Value Date   TSH 2.31 02/17/2020   Lab Results  Component Value Date   WBC 5.7 04/02/2021   HGB 12.4 04/02/2021   HCT 36.6 04/02/2021   MCV 93.3 04/02/2021   PLT 273.0 04/02/2021   Lab Results  Component Value Date   NA 135 04/02/2021   K 3.8 04/02/2021   CO2 29 04/02/2021   GLUCOSE 292 (H) 04/02/2021   BUN 15 04/02/2021   CREATININE 0.81 04/02/2021   BILITOT 0.6 04/02/2021   ALKPHOS 61 04/02/2021   AST 13 04/02/2021   ALT 16 04/02/2021   PROT 7.0 04/02/2021   ALBUMIN 4.1 04/02/2021   CALCIUM 9.8 04/02/2021   GFR 76.75 04/02/2021   Lab Results  Component  Value Date   CHOL 173 04/02/2021   Lab Results  Component Value Date   HDL 38.80 (L) 04/02/2021   Lab Results  Component Value Date   LDLCALC 96 04/02/2021   Lab Results  Component Value Date   TRIG 192.0 (H) 04/02/2021   Lab Results  Component Value Date   CHOLHDL 4 04/02/2021   Lab Results  Component Value Date   HGBA1C 10.0 (A) 06/25/2021        Colonoscopy: Last completed on 10/27/2013. Polyps were resected and retrieved. Repeat in 10 years. Mammogram: Last checked on 12/08/2020. Results wee normal. Repeat in 1 year.  Assessment & Plan:   Problem List Items Addressed This Visit       Unprioritized   Hyperlipidemia associated with type 2 diabetes mellitus ([place])    Encourage heart healthy diet such as MIND or DASH diet, increase exercise, avoid trans fats, simple carbohydrates and processed foods, consider a krill or fish or flaxseed oil cap daily.       Relevant Medications   carvedilol (COREG) 25 MG tablet   Other Relevant Orders   CBC with Differential/Platelet   Lipid panel   TSH   Hypertension    Well controlled, no changes to meds. Encouraged heart healthy diet such as the DASH diet and exercise as tolerated.        Relevant Medications   carvedilol (COREG) 25 MG tablet   Other Relevant Orders   CBC with Differential/Platelet   Lipid panel   TSH   Preventative health care - Primary    ghm utd  Check labs See avs       Relevant Orders   CBC with Differential/Platelet   Lipid panel   TSH   Uncontrolled type 2 diabetes mellitus with hyperglycemia ([place])    hgba1c to be checked, minimize simple carbs. Increase exercise as tolerated. Continue current meds       Other Visit Diagnoses     Type 2 diabetes mellitus with other specified complication, without long-term current use of insulin ([place])       Relevant Orders   CBC with Differential/Platelet   Lipid panel   TSH   Need for hepatitis C screening test       Relevant Orders   Hepatitis  C antibody   Need for tetanus booster       Relevant Orders   Tdap vaccine greater than or equal to 7yo IM (Completed)   Estrogen deficiency       Relevant Orders   DG Bone Density       Meds ordered this encounter  Medications   DISCONTD: carvedilol (COREG) 25 MG tablet    Sig: Take 1 tablet (25 mg total) by mouth 2 (two) times daily with a meal.    Dispense:  180 tablet    Refill:  3   carvedilol (COREG) 25 MG tablet    Sig: Take 1 tablet (25 mg total) by mouth 2 (two) times daily with a meal.    Dispense:  180 tablet    Refill:  3    I,Jordan Joseph,acting as a scribe for [employer], DO.,have documented all relevant documentation on the behalf of Jordan Held, DO,as directed by  Jordan Held, DO while in the presence of Jordan Held, DO.   I, Jordan Held, DO., personally preformed the services described in this documentation.  All medical record entries made by the scribe were at my direction and in my presence.  I have reviewed the chart and discharge instructions (if applicable) and agree that the record reflects my personal performance and is accurate and complete. 10/18/2021

## 2021-10-18 NOTE — Assessment & Plan Note (Signed)
Well controlled, no changes to meds. Encouraged heart healthy diet such as the DASH diet and exercise as tolerated.  °

## 2021-10-21 LAB — TSH: TSH: 1.76 mIU/L (ref 0.40–4.50)

## 2021-10-21 LAB — CBC WITH DIFFERENTIAL/PLATELET
Absolute Monocytes: 421 cells/uL (ref 200–950)
Basophils Absolute: 41 cells/uL (ref 0–200)
Basophils Relative: 0.6 %
Eosinophils Absolute: 138 cells/uL (ref 15–500)
Eosinophils Relative: 2 %
HCT: 37.3 % (ref 35.0–45.0)
Hemoglobin: 12.8 g/dL (ref 11.7–15.5)
Lymphs Abs: 1884 cells/uL (ref 850–3900)
MCH: 32.6 pg (ref 27.0–33.0)
MCHC: 34.3 g/dL (ref 32.0–36.0)
MCV: 94.9 fL (ref 80.0–100.0)
MPV: 10.6 fL (ref 7.5–12.5)
Monocytes Relative: 6.1 %
Neutro Abs: 4416 cells/uL (ref 1500–7800)
Neutrophils Relative %: 64 %
Platelets: 299 10*3/uL (ref 140–400)
RBC: 3.93 10*6/uL (ref 3.80–5.10)
RDW: 12.6 % (ref 11.0–15.0)
Total Lymphocyte: 27.3 %
WBC: 6.9 10*3/uL (ref 3.8–10.8)

## 2021-10-21 LAB — LIPID PANEL
Cholesterol: 132 mg/dL (ref <200)
HDL: 39 mg/dL — ABNORMAL LOW (ref >=50)
LDL Cholesterol (Calc): 72 mg/dL (calc)
Non-HDL Cholesterol (Calc): 93 mg/dL (calc) (ref <130)
Total CHOL/HDL Ratio: 3.4 (calc) (ref <5.0)
Triglycerides: 124 mg/dL (ref <150)

## 2021-10-21 LAB — HEPATITIS C ANTIBODY
Hepatitis C Ab: NONREACTIVE
SIGNAL TO CUT-OFF: <0.02 (ref <1.00)

## 2021-10-22 ENCOUNTER — Other Ambulatory Visit (HOSPITAL_BASED_OUTPATIENT_CLINIC_OR_DEPARTMENT_OTHER): Payer: Self-pay | Admitting: Family Medicine

## 2021-10-22 DIAGNOSIS — Z1231 Encounter for screening mammogram for malignant neoplasm of breast: Secondary | ICD-10-CM

## 2021-10-29 ENCOUNTER — Ambulatory Visit: Payer: 59 | Admitting: Internal Medicine

## 2021-11-01 ENCOUNTER — Encounter: Payer: Self-pay | Admitting: *Deleted

## 2021-12-17 ENCOUNTER — Ambulatory Visit (HOSPITAL_BASED_OUTPATIENT_CLINIC_OR_DEPARTMENT_OTHER)
Admission: RE | Admit: 2021-12-17 | Discharge: 2021-12-17 | Disposition: A | Discharge status: Home / Self Care | Payer: 59 | Source: Ambulatory Visit | Attending: Family Medicine | Admitting: Family Medicine

## 2021-12-17 ENCOUNTER — Other Ambulatory Visit: Payer: Self-pay

## 2021-12-17 ENCOUNTER — Encounter (HOSPITAL_BASED_OUTPATIENT_CLINIC_OR_DEPARTMENT_OTHER): Payer: Self-pay

## 2021-12-17 DIAGNOSIS — E2839 Other primary ovarian failure: Secondary | ICD-10-CM | POA: Insufficient documentation

## 2021-12-17 DIAGNOSIS — Z1231 Encounter for screening mammogram for malignant neoplasm of breast: Secondary | ICD-10-CM | POA: Diagnosis present

## 2022-01-09 ENCOUNTER — Other Ambulatory Visit: Payer: Self-pay | Admitting: Family Medicine

## 2022-01-09 DIAGNOSIS — I10 Essential (primary) hypertension: Secondary | ICD-10-CM

## 2022-02-20 ENCOUNTER — Encounter: Payer: Self-pay | Admitting: Family Medicine

## 2022-02-20 ENCOUNTER — Other Ambulatory Visit (HOSPITAL_COMMUNITY)
Admission: RE | Admit: 2022-02-20 | Discharge: 2022-02-20 | Disposition: A | Discharge status: Home / Self Care | Payer: 59 | Source: Ambulatory Visit | Attending: Family Medicine | Admitting: Family Medicine

## 2022-02-20 ENCOUNTER — Ambulatory Visit (INDEPENDENT_AMBULATORY_CARE_PROVIDER_SITE_OTHER): Payer: 59 | Admitting: Family Medicine

## 2022-02-20 VITALS — BP 152/79 | HR 89 | Ht 65.0 in | Wt 181.0 lb

## 2022-02-20 DIAGNOSIS — Z01419 Encounter for gynecological examination (general) (routine) without abnormal findings: Secondary | ICD-10-CM

## 2022-02-20 DIAGNOSIS — N3946 Mixed incontinence: Secondary | ICD-10-CM | POA: Diagnosis not present

## 2022-02-20 DIAGNOSIS — N814 Uterovaginal prolapse, unspecified: Secondary | ICD-10-CM | POA: Diagnosis not present

## 2022-02-20 NOTE — Progress Notes (Signed)
GYNECOLOGY ANNUAL PREVENTATIVE CARE ENCOUNTER NOTE  Subjective:   Jordan Joseph is a 66 y.o. G36P2002 female here for a routine annual gynecologic exam.  Current complaints: having urine incontinence over the past couple of months. Describes needing to use the bathroom immediately when she feels the urge. Happens mostly when she moves gets up out of car after driving. Did have bladder suspension in her early 63's. No other surgeries.  Denies abnormal vaginal bleeding, discharge, pelvic pain, problems with intercourse or other gynecologic concerns.    Gynecologic History No LMP recorded (lmp unknown). Patient is postmenopausal. Contraception: post menopausal status Last Pap: about 5 years ago. Results were: normal. No history of abnormal PAP Last mammogram: 11/2021. Results were: birads 1  The pregnancy intention screening data noted above was reviewed. Potential methods of contraception were discussed. The patient elected to proceed with No data recorded.   Obstetric History OB History  Gravida Para Term Preterm AB Living  2 2 2     2   SAB IAB Ectopic Multiple Live Births          2    # Outcome Date GA Lbr Len/2nd Weight Sex Delivery Anes PTL Lv  2 Term 80 [redacted]w[redacted]d   M Vag-Spont None N LIV  1 Term 1975 [redacted]w[redacted]d   F Vag-Spont EPI N LIV    Past Medical History:  Diagnosis Date   Diabetes mellitus type II, uncontrolled    HTN (hypertension)     Past Surgical History:  Procedure Laterality Date   CHOLECYSTECTOMY     COLONOSCOPY  10/27/2013   10/29/2013. [name], MD. left sided diverticulosis. O/W normar screening colonoscopy.    Current Outpatient Medications on File Prior to Visit  Medication Sig Dispense Refill   amLODipine (NORVASC) 10 MG tablet Take 1 tablet by mouth once daily 90 tablet 1   carvedilol (COREG) 25 MG tablet Take 1 tablet (25 mg total) by mouth 2 (two) times daily with a meal. 180 tablet 3   dapagliflozin propanediol (FARXIGA) 5 MG TABS tablet Take 1 tablet (5 mg  total) by mouth daily before breakfast. 30 tablet 6   glucose blood (FREESTYLE LITE) test strip 1 each by Other route daily in the afternoon. Use as instructed 90 each 3   insulin degludec (TRESIBA FLEXTOUCH) 100 UNIT/ML FlexTouch Pen Inject 26 Units into the skin daily. 15 mL 11   losartan-hydrochlorothiazide (HYZAAR) 100-25 MG tablet Take 1 tablet by mouth once daily 90 tablet 0   metFORMIN (GLUCOPHAGE-XR) 500 MG 24 hr tablet Take 2 tablets (1,000 mg total) by mouth in the morning and at bedtime. 180 tablet 3   simvastatin (ZOCOR) 20 MG tablet Take 1 tablet (20 mg total) by mouth at bedtime. 90 tablet 1   UNABLE TO FIND Med Name: [name] blood sugar meter (Patient not taking: Reported on 02/20/2022)     No current facility-administered medications on file prior to visit.    No Known Allergies  Social History   Socioeconomic History   Marital status: Divorced    Spouse name: Not on file   Number of children: 2   Years of education: Not on file   Highest education level: Not on file  Occupational History   Occupation: 02/22/2022  Tobacco Use   Smoking status: Never   Smokeless tobacco: Never  Vaping Use   Vaping Use: Never used  Substance and Sexual Activity   Alcohol use: Never   Drug use: Never   Sexual activity:  Not Currently  Other Topics Concern   Not on file  Social History Narrative   Exercise -- walking 5000 steps    15 min trampoline   15 min gazelle    Social Determinants of Corporate investment banker Strain: Not on file  Food Insecurity: Not on file  Transportation Needs: Not on file  Physical Activity: Not on file  Stress: Not on file  Social Connections: Not on file  Intimate Partner Violence: Not on file    Family History  Problem Relation Age of Onset   Alzheimer's disease Mother    Hypertension Father    Cirrhosis Father    Diabetes Mellitus II Father    Diabetes Brother    Hypertension Brother    Colon cancer Neg Hx    Rectal cancer Neg  Hx    Pancreatic cancer Neg Hx    Stomach cancer Neg Hx    Esophageal cancer Neg Hx    Liver cancer Neg Hx     The following portions of the patient's history were reviewed and updated as appropriate: allergies, current medications, past family history, past medical history, past social history, past surgical history and problem list.  Review of Systems Pertinent items are noted in HPI.   Objective:  BP (!) 152/79   Pulse 89   Ht 5\' 5"  (1.651 m)   Wt 181 lb (82.1 kg)   LMP  (LMP Unknown)   BMI 30.12 kg/m  Wt Readings from Last 3 Encounters:  02/20/22 181 lb (82.1 kg)  10/18/21 175 lb (79.4 kg)  08/16/21 177 lb (80.3 kg)     Chaperone present during exam  CONSTITUTIONAL: Well-developed, well-nourished female in no acute distress.  HENT:  Normocephalic, atraumatic, External right and left ear normal. Oropharynx is clear and moist EYES: Conjunctivae and EOM are normal. Pupils are equal, round, and reactive to light. No scleral icterus.  NECK: Normal range of motion, supple, no masses.  Normal thyroid.   CARDIOVASCULAR: Normal heart rate noted, regular rhythm RESPIRATORY: Clear to auscultation bilaterally. Effort and breath sounds normal, no problems with respiration noted. BREASTS: Symmetric in size. No masses, skin changes, nipple drainage, or lymphadenopathy. ABDOMEN: Soft, normal bowel sounds, no distention noted.  No tenderness, rebound or guarding.  PELVIC: Normal appearing external genitalia; atrophic appearing vaginal mucosa and cervix.  No abnormal discharge noted. Atrophic appearing cervix. Grade 1 cystocele. MUSCULOSKELETAL: Normal range of motion. No tenderness.  No cyanosis, clubbing, or edema.  2+ distal pulses. SKIN: Skin is warm and dry. No rash noted. Not diaphoretic. No erythema. No pallor. NEUROLOGIC: Alert and oriented to person, place, and time. Normal reflexes, muscle tone coordination. No cranial nerve deficit noted. PSYCHIATRIC: Normal mood and affect.  Normal behavior. Normal judgment and thought content.  Assessment:  Annual gynecologic examination with pap smear   Plan:  1. Well Woman Exam Will follow up results of pap smear and manage accordingly. Mammogram reviewed - Cytology - PAP( [place])  2. Mixed incontinence Will refer to urogyn. - Ambulatory referral to Urogynecology  3. Cystocele with prolapse  - Ambulatory referral to Urogynecology   Routine preventative health maintenance measures emphasized. Please refer to After Visit Summary for other counseling recommendations.    08/18/21, DO [facility]

## 2022-02-21 LAB — CYTOLOGY - PAP
Comment: NEGATIVE
Diagnosis: NEGATIVE
High risk HPV: NEGATIVE

## 2022-04-18 ENCOUNTER — Ambulatory Visit: Payer: 59 | Admitting: Family Medicine

## 2022-05-02 ENCOUNTER — Other Ambulatory Visit: Payer: Self-pay | Admitting: Family Medicine

## 2022-05-02 DIAGNOSIS — I10 Essential (primary) hypertension: Secondary | ICD-10-CM

## 2022-05-04 ENCOUNTER — Other Ambulatory Visit: Payer: Self-pay | Admitting: Family Medicine

## 2022-05-04 ENCOUNTER — Other Ambulatory Visit: Payer: Self-pay | Admitting: Internal Medicine

## 2022-05-04 DIAGNOSIS — I10 Essential (primary) hypertension: Secondary | ICD-10-CM

## 2022-05-05 ENCOUNTER — Encounter: Payer: Self-pay | Admitting: Family Medicine

## 2022-05-05 ENCOUNTER — Ambulatory Visit (INDEPENDENT_AMBULATORY_CARE_PROVIDER_SITE_OTHER): Payer: Medicare HMO | Admitting: Family Medicine

## 2022-05-05 VITALS — BP 158/100 | HR 84 | Temp 98.6°F | Resp 18 | Ht 65.0 in | Wt 181.6 lb

## 2022-05-05 DIAGNOSIS — E1169 Type 2 diabetes mellitus with other specified complication: Secondary | ICD-10-CM

## 2022-05-05 DIAGNOSIS — E785 Hyperlipidemia, unspecified: Secondary | ICD-10-CM

## 2022-05-05 DIAGNOSIS — I10 Essential (primary) hypertension: Secondary | ICD-10-CM | POA: Diagnosis not present

## 2022-05-05 DIAGNOSIS — R011 Cardiac murmur, unspecified: Secondary | ICD-10-CM

## 2022-05-05 MED ORDER — CARVEDILOL 25 MG PO TABS: 25.0000 mg | ORAL_TABLET | Freq: Two times a day (BID) | ORAL | 3 refills | Status: DC

## 2022-05-05 MED ORDER — LOSARTAN POTASSIUM-HCTZ 100-25 MG PO TABS: 1.0000 | ORAL_TABLET | Freq: Every day | ORAL | 1 refills | Status: DC

## 2022-05-05 MED ORDER — SIMVASTATIN 20 MG PO TABS: 20.0000 mg | ORAL_TABLET | Freq: Every day | ORAL | 1 refills | Status: DC

## 2022-05-05 MED ORDER — AMLODIPINE BESYLATE 10 MG PO TABS: 10.0000 mg | ORAL_TABLET | Freq: Every day | ORAL | 1 refills | Status: DC

## 2022-05-05 MED ORDER — CLONIDINE HCL 0.1 MG PO TABS: 0.1000 mg | ORAL_TABLET | Freq: Two times a day (BID) | ORAL | 3 refills | Status: DC

## 2022-05-05 NOTE — Progress Notes (Signed)
Subjective:   By signing my name below, I, Jordan Joseph, attest that this documentation has been prepared under the direction and in the presence of Jordan Joseph 05/05/2022   Patient ID: Jordan Joseph, female    DOB: Nov 11, 1955, 66 y.o.   MRN: [id number]  Chief Complaint  Patient presents with   Hypertension   Follow-up    HPI Patient is in today for an office visit  She is requesting a refill of 10 Mg of Amlodipine, 25 Mg of Carvedilol. 100-25 Mg of Hyzaar and 20 Mg of Simvastatin.   As of today's visit, her blood pressure is high. She states that recently she has been having stress and tension. The stress comes from church and familial stress and before she came to this appointment, she stated that she was in tears. She is currently taking 10 Mg of Amlodipine, 25 Mg of Carvedilol, and 100-25 Mg of Hyzaar BP Readings from Last 3 Encounters:  05/05/22 (!) 158/100  02/20/22 (!) 152/79  10/18/21 (!) 146/100   Pulse Readings from Last 3 Encounters:  05/05/22 84  02/20/22 89  10/18/21 81   She reports that her blood sugar levels are stable.  Lab Results  Component Value Date   HGBA1C 10.0 (A) 06/25/2021    Past Medical History:  Diagnosis Date   Diabetes mellitus type II, uncontrolled    HTN (hypertension)     Past Surgical History:  Procedure Laterality Date   CHOLECYSTECTOMY     COLONOSCOPY  10/27/2013   Jordan Neat. [name], MD. left sided diverticulosis. O/W normar screening colonoscopy.    Family History  Problem Relation Age of Onset   Alzheimer's disease Mother    Hypertension Father    Cirrhosis Father    Diabetes Mellitus II Father    Diabetes Brother    Hypertension Brother    Colon cancer Neg Hx    Rectal cancer Neg Hx    Pancreatic cancer Neg Hx    Stomach cancer Neg Hx    Esophageal cancer Neg Hx    Liver cancer Neg Hx     Social History   Socioeconomic History   Marital status: Divorced    Spouse name: Not on file   Number of  children: 2   Years of education: Not on file   Highest education level: Not on file  Occupational History   Occupation: Curator  Tobacco Use   Smoking status: Never   Smokeless tobacco: Never  Vaping Use   Vaping Use: Never used  Substance and Sexual Activity   Alcohol use: Never   Drug use: Never   Sexual activity: Not Currently  Other Topics Concern   Not on file  Social History Narrative   Exercise -- walking 5000 steps    15 min trampoline   15 min gazelle    Social Determinants of Radio broadcast assistant Strain: Not on file  Food Insecurity: Not on file  Transportation Needs: Not on file  Physical Activity: Not on file  Stress: Not on file  Social Connections: Not on file  Intimate Partner Violence: Not on file    Outpatient Medications Prior to Visit  Medication Sig Dispense Refill   dapagliflozin propanediol (FARXIGA) 5 MG TABS tablet Take 1 tablet (5 mg total) by mouth daily before breakfast. 30 tablet 6   glucose blood (FREESTYLE LITE) test strip 1 each by Other route daily in the afternoon. Use as instructed 90 each 3  insulin degludec (TRESIBA FLEXTOUCH) 100 UNIT/ML FlexTouch Pen Inject 26 Units into the skin daily. 15 mL 11   metFORMIN (GLUCOPHAGE-XR) 500 MG 24 hr tablet Take 2 tablets (1,000 mg total) by mouth in the morning and at bedtime. 180 tablet 3   amLODipine (NORVASC) 10 MG tablet Take 1 tablet by mouth once daily 30 tablet 0   carvedilol (COREG) 25 MG tablet Take 1 tablet (25 mg total) by mouth 2 (two) times daily with a meal. 180 tablet 3   losartan-hydrochlorothiazide (HYZAAR) 100-25 MG tablet Take 1 tablet by mouth once daily 90 tablet 0   simvastatin (ZOCOR) 20 MG tablet Take 1 tablet (20 mg total) by mouth at bedtime. 90 tablet 1   UNABLE TO FIND Med Name: Jordan Joseph blood sugar meter (Patient not taking: Reported on 02/20/2022)     No facility-administered medications prior to visit.    No Known Allergies  Review of Systems   Constitutional:  Negative for fever and malaise/fatigue.  HENT:  Negative for congestion.   Eyes:  Negative for blurred vision.  Respiratory:  Negative for cough and shortness of breath.   Cardiovascular:  Negative for chest pain, palpitations and leg swelling.  Gastrointestinal:  Negative for vomiting.  Musculoskeletal:  Negative for back pain.  Skin:  Negative for rash.  Neurological:  Negative for loss of consciousness and headaches.       Objective:    Physical Exam Vitals and nursing note reviewed.  Constitutional:      General: She is not in acute distress.    Appearance: Normal appearance. She is well-developed. She is not ill-appearing.  HENT:     Head: Normocephalic and atraumatic.     Right Ear: External ear normal.     Left Ear: External ear normal.  Eyes:     Extraocular Movements: Extraocular movements intact.     Conjunctiva/sclera: Conjunctivae normal.     Pupils: Pupils are equal, round, and reactive to light.  Neck:     Thyroid: No thyromegaly.     Vascular: No carotid bruit or JVD.  Cardiovascular:     Rate and Rhythm: Normal rate and regular rhythm.     Heart sounds: Normal heart sounds. No murmur heard.    No gallop.  Pulmonary:     Effort: Pulmonary effort is normal. No respiratory distress.     Breath sounds: Normal breath sounds. No wheezing or rales.  Chest:     Chest wall: No tenderness.  Musculoskeletal:     Cervical back: Normal range of motion and neck supple.  Skin:    General: Skin is warm and dry.  Neurological:     Mental Status: She is alert and oriented to person, place, and time.  Psychiatric:        Judgment: Judgment normal.     BP (!) 158/100   Pulse 84   Temp 98.6 F (37 C) (Oral)   Resp 18   Ht 5\' 5"  (1.651 m)   Wt 181 lb 9.6 oz (82.4 kg)   LMP  (LMP Unknown)   SpO2 97%   BMI 30.22 kg/m  Wt Readings from Last 3 Encounters:  05/05/22 181 lb 9.6 oz (82.4 kg)  02/20/22 181 lb (82.1 kg)  10/18/21 175 lb (79.4 kg)     Diabetic Foot Exam - Simple   No data filed    Lab Results  Component Value Date   WBC 6.9 10/18/2021   HGB 12.8 10/18/2021   HCT 37.3 10/18/2021  PLT 299 10/18/2021   GLUCOSE 292 (H) 04/02/2021   CHOL 132 10/18/2021   TRIG 124 10/18/2021   HDL 39 (L) 10/18/2021   LDLDIRECT 82.0 09/11/2020   LDLCALC 72 10/18/2021   ALT 16 04/02/2021   AST 13 04/02/2021   NA 135 04/02/2021   K 3.8 04/02/2021   CL 99 04/02/2021   CREATININE 0.81 04/02/2021   BUN 15 04/02/2021   CO2 29 04/02/2021   TSH 1.76 10/18/2021   HGBA1C 10.0 (A) 06/25/2021   MICROALBUR 28.7 (H) 06/21/2021    Lab Results  Component Value Date   TSH 1.76 10/18/2021   Lab Results  Component Value Date   WBC 6.9 10/18/2021   HGB 12.8 10/18/2021   HCT 37.3 10/18/2021   MCV 94.9 10/18/2021   PLT 299 10/18/2021   Lab Results  Component Value Date   NA 135 04/02/2021   K 3.8 04/02/2021   CO2 29 04/02/2021   GLUCOSE 292 (H) 04/02/2021   BUN 15 04/02/2021   CREATININE 0.81 04/02/2021   BILITOT 0.6 04/02/2021   ALKPHOS 61 04/02/2021   AST 13 04/02/2021   ALT 16 04/02/2021   PROT 7.0 04/02/2021   ALBUMIN 4.1 04/02/2021   CALCIUM 9.8 04/02/2021   GFR 76.75 04/02/2021   Lab Results  Component Value Date   CHOL 132 10/18/2021   Lab Results  Component Value Date   HDL 39 (L) 10/18/2021   Lab Results  Component Value Date   LDLCALC 72 10/18/2021   Lab Results  Component Value Date   TRIG 124 10/18/2021   Lab Results  Component Value Date   CHOLHDL 3.4 10/18/2021   Lab Results  Component Value Date   HGBA1C 10.0 (A) 06/25/2021       Assessment & Plan:   Problem List Items Addressed This Visit       Unprioritized   Hypertension    Poorly controlled will alter medications, encouraged DASH diet, minimize caffeine and obtain adequate sleep. Report concerning symptoms and follow up as directed and as needed      Relevant Medications   amLODipine (NORVASC) 10 MG tablet   carvedilol  (COREG) 25 MG tablet   simvastatin (ZOCOR) 20 MG tablet   losartan-hydrochlorothiazide (HYZAAR) 100-25 MG tablet   cloNIDine (CATAPRES) 0.1 MG tablet   Other Relevant Orders   Comprehensive metabolic panel   Lipid panel   CBC with Differential/Platelet   ECHOCARDIOGRAM COMPLETE   Hyperlipidemia associated with type 2 diabetes mellitus (HCC)    Encourage heart healthy diet such as MIND or DASH diet, increase exercise, avoid trans fats, simple carbohydrates and processed foods, consider a krill or fish or flaxseed oil cap daily.       Relevant Medications   amLODipine (NORVASC) 10 MG tablet   carvedilol (COREG) 25 MG tablet   simvastatin (ZOCOR) 20 MG tablet   losartan-hydrochlorothiazide (HYZAAR) 100-25 MG tablet   cloNIDine (CATAPRES) 0.1 MG tablet   Other Relevant Orders   Comprehensive metabolic panel   Lipid panel   CBC with Differential/Platelet   Other Visit Diagnoses     Murmur    -  Primary   Relevant Orders   ECHOCARDIOGRAM COMPLETE       Meds ordered this encounter  Medications   amLODipine (NORVASC) 10 MG tablet    Sig: Take 1 tablet (10 mg total) by mouth daily.    Dispense:  90 tablet    Refill:  1   carvedilol (COREG) 25 MG tablet  Sig: Take 1 tablet (25 mg total) by mouth 2 (two) times daily with a meal.    Dispense:  180 tablet    Refill:  3   simvastatin (ZOCOR) 20 MG tablet    Sig: Take 1 tablet (20 mg total) by mouth at bedtime.    Dispense:  90 tablet    Refill:  1   losartan-hydrochlorothiazide (HYZAAR) 100-25 MG tablet    Sig: Take 1 tablet by mouth daily.    Dispense:  90 tablet    Refill:  1   cloNIDine (CATAPRES) 0.1 MG tablet    Sig: Take 1 tablet (0.1 mg total) by mouth 2 (two) times daily.    Dispense:  60 tablet    Refill:  3    I, Donato Schultz, Joseph, personally preformed the services described in this documentation.  All medical record entries made by the scribe were at my direction and in my presence.  I have reviewed the  chart and discharge instructions (if applicable) and agree that the record reflects my personal performance and is accurate and complete. 05/05/2022   I,Amber Collins,acting as a scribe for Donato Schultz, Joseph.,have documented all relevant documentation on the behalf of Donato Schultz, Joseph,as directed by  Donato Schultz, Joseph while in the presence of Donato Schultz, Joseph.    Donato Schultz, Joseph

## 2022-05-05 NOTE — Patient Instructions (Signed)

## 2022-05-05 NOTE — Assessment & Plan Note (Addendum)
Poorly controlled will alter medications, encouraged DASH diet, minimize caffeine and obtain adequate sleep. Report concerning symptoms and follow up as directed and as needed 

## 2022-05-05 NOTE — Assessment & Plan Note (Signed)
Encourage heart healthy diet such as MIND or DASH diet, increase exercise, avoid trans fats, simple carbohydrates and processed foods, consider a krill or fish or flaxseed oil cap daily.  °

## 2022-05-06 LAB — COMPREHENSIVE METABOLIC PANEL
ALT: 17 U/L (ref 0–35)
AST: 14 U/L (ref 0–37)
Albumin: 4.2 g/dL (ref 3.5–5.2)
Alkaline Phosphatase: 75 U/L (ref 39–117)
BUN: 9 mg/dL (ref 6–23)
CO2: 27 mEq/L (ref 19–32)
Calcium: 9.7 mg/dL (ref 8.4–10.5)
Chloride: 101 mEq/L (ref 96–112)
Creatinine, Ser: 0.81 mg/dL (ref 0.40–1.20)
GFR: 76.16 mL/min (ref >60.00)
Glucose, Bld: 317 mg/dL — ABNORMAL HIGH (ref 70–99)
Potassium: 4.2 mEq/L (ref 3.5–5.1)
Sodium: 137 mEq/L (ref 135–145)
Total Bilirubin: 0.5 mg/dL (ref 0.2–1.2)
Total Protein: 7.2 g/dL (ref 6.0–8.3)

## 2022-05-06 LAB — CBC WITH DIFFERENTIAL/PLATELET
Basophils Absolute: 0.1 10*3/uL (ref 0.0–0.1)
Basophils Relative: 1 % (ref 0.0–3.0)
Eosinophils Absolute: 0.3 10*3/uL (ref 0.0–0.7)
Eosinophils Relative: 4.2 % (ref 0.0–5.0)
HCT: 38.4 % (ref 36.0–46.0)
Hemoglobin: 13 g/dL (ref 12.0–15.0)
Lymphocytes Relative: 25.1 % (ref 12.0–46.0)
Lymphs Abs: 1.5 10*3/uL (ref 0.7–4.0)
MCHC: 33.8 g/dL (ref 30.0–36.0)
MCV: 95.9 fl (ref 78.0–100.0)
Monocytes Absolute: 0.3 10*3/uL (ref 0.1–1.0)
Monocytes Relative: 5.7 % (ref 3.0–12.0)
Neutro Abs: 3.9 10*3/uL (ref 1.4–7.7)
Neutrophils Relative %: 64 % (ref 43.0–77.0)
Platelets: 254 10*3/uL (ref 150.0–400.0)
RBC: 4.01 Mil/uL (ref 3.87–5.11)
RDW: 13.5 % (ref 11.5–15.5)
WBC: 6.1 10*3/uL (ref 4.0–10.5)

## 2022-05-06 LAB — LIPID PANEL
Cholesterol: 191 mg/dL (ref 0–200)
HDL: 39.6 mg/dL (ref >39.00)
NonHDL: 151.78
Total CHOL/HDL Ratio: 5
Triglycerides: 237 mg/dL — ABNORMAL HIGH (ref 0.0–149.0)
VLDL: 47.4 mg/dL — ABNORMAL HIGH (ref 0.0–40.0)

## 2022-05-06 LAB — LDL CHOLESTEROL, DIRECT: Direct LDL: 84 mg/dL

## 2022-05-07 ENCOUNTER — Telehealth: Payer: Self-pay

## 2022-05-07 NOTE — Telephone Encounter (Signed)
Pt called on the Triage phone- returning a call to verify her insurance.   CB number is [phone].

## 2022-05-12 ENCOUNTER — Encounter: Payer: Self-pay | Admitting: Obstetrics and Gynecology

## 2022-05-12 ENCOUNTER — Other Ambulatory Visit: Payer: Self-pay

## 2022-05-12 ENCOUNTER — Ambulatory Visit: Payer: Medicare HMO | Admitting: Obstetrics and Gynecology

## 2022-05-12 VITALS — BP 150/85 | HR 80 | Ht 63.5 in | Wt 181.0 lb

## 2022-05-12 DIAGNOSIS — R350 Frequency of micturition: Secondary | ICD-10-CM | POA: Diagnosis not present

## 2022-05-12 DIAGNOSIS — N3281 Overactive bladder: Secondary | ICD-10-CM

## 2022-05-12 DIAGNOSIS — N8110 Cystocele, unspecified: Secondary | ICD-10-CM

## 2022-05-12 DIAGNOSIS — E1169 Type 2 diabetes mellitus with other specified complication: Secondary | ICD-10-CM

## 2022-05-12 LAB — POCT URINALYSIS DIPSTICK
Bilirubin, UA: NEGATIVE
Blood, UA: NEGATIVE
Glucose, UA: NEGATIVE
Ketones, UA: NEGATIVE
Leukocytes, UA: NEGATIVE
Nitrite, UA: NEGATIVE
Protein, UA: POSITIVE — AB
Spec Grav, UA: 1.015 (ref 1.010–1.025)
Urobilinogen, UA: 0.2 U/dL
pH, UA: 5.5 (ref 5.0–8.0)

## 2022-05-12 MED ORDER — DAPAGLIFLOZIN PROPANEDIOL 5 MG PO TABS: 5.0000 mg | ORAL_TABLET | Freq: Every day | ORAL | 2 refills | Status: DC

## 2022-05-12 MED ORDER — TROSPIUM CHLORIDE ER 60 MG PO CP24: 1.0000 | ORAL_CAPSULE | Freq: Every day | ORAL | 5 refills | Status: AC

## 2022-05-12 NOTE — Patient Instructions (Signed)

## 2022-05-12 NOTE — Progress Notes (Signed)
[facility] New Patient Evaluation and Consultation  Referring Provider: Truett Mainland, DO PCP: Ann Held, DO Date of Service: 05/12/2022  SUBJECTIVE Chief Complaint: New Patient (Initial Visit) Jordan Joseph is a 66 y.o. female complains of incontinence./)  History of Present Illness: Jordan Joseph is a 66 y.o. Black or African-American female seen in consultation at the request of Dr. Nehemiah Settle for evaluation of prolapse.    Review of records significant for: Has increased urinary incontinence and urgency. Has history of bladder suspension.   Urinary Symptoms: Leaks urine with with a full bladder and with urgency Leaks 10-15 time(s) per day.  Pad use: 4 pads per day.   She is bothered by her UI symptoms. Has a history of urethral sling- denies leakage with cough/ sneeze.  Day time voids 10.  Nocturia: 3 times per night to void. Voiding dysfunction: she does not empty her bladder well.  does not use a catheter to empty bladder.  When urinating, she feels the need to urinate multiple times in a row Drinks: 16oz decaf coffee, 2- 16oz bottle water, 2- 20oz no sugar soda (coke) per day. Can't have watermelon or any fruit with a lot of fluid.  She is not always checking her blood sugar at home. Has an endocrinologist- seeing them in September.   UTIs:  0  UTI's in the last year.   Denies history of blood in urine and kidney or bladder stones  Pelvic Organ Prolapse Symptoms:                  She Denies a feeling of a bulge the vaginal area.   Bowel Symptom: Bowel movements: 1-2 time(s) per day Stool consistency: soft  Straining: no.  Splinting: no.  Incomplete evacuation: no.  She denies accidental bowel leakage / fecal incontinence Bowel regimen: none Last colonoscopy: Date 2015  Sexual Function Sexually active: no.    Pelvic Pain Denies pelvic pain   Past Medical History:  Past Medical History:  Diagnosis Date   Diabetes mellitus type II,  uncontrolled    HTN (hypertension)      Past Surgical History:   Past Surgical History:  Procedure Laterality Date   CHOLECYSTECTOMY     COLONOSCOPY  10/27/2013   Wynona Neat. [name], MD. left sided diverticulosis. O/W normar screening colonoscopy.   Suburethral sling     For incontinence- 2000     Past OB/GYN History: OB History  Gravida Para Term Preterm AB Living  2 2 2     2   SAB IAB Ectopic Multiple Live Births          2    # Outcome Date GA Lbr Len/2nd Weight Sex Delivery Anes PTL Lv  2 Term 44 [redacted]w[redacted]d   M Vag-Spont None N LIV  1 Term 1975 [redacted]w[redacted]d   F Vag-Spont EPI N LIV    Menopausal: Yes, at age 31, Denies vaginal bleeding since menopause Last pap smear was 01/2022- negative.     Medications: She has a current medication list which includes the following prescription(s): amlodipine, carvedilol, clonidine, dapagliflozin propanediol, freestyle lite, tresiba flextouch, losartan-hydrochlorothiazide, metformin, simvastatin, and trospium chloride.   Allergies: Patient has No Known Allergies.   Social History:  Social History   Tobacco Use   Smoking status: Never   Smokeless tobacco: Never  Vaping Use   Vaping Use: Never used  Substance Use Topics   Alcohol use: Never   Drug use: Never    Relationship status: single  She lives alone She is employed as a Education officer, environmental. Regular exercise: No History of abuse: No  Family History:   Family History  Problem Relation Age of Onset   Alzheimer's disease Mother    Hypertension Father    Cirrhosis Father    Diabetes Mellitus II Father    Diabetes Brother    Hypertension Brother    Colon cancer Neg Hx    Rectal cancer Neg Hx    Pancreatic cancer Neg Hx    Stomach cancer Neg Hx    Esophageal cancer Neg Hx    Liver cancer Neg Hx      Review of Systems: Review of Systems  Constitutional:  Negative for fever, malaise/fatigue and weight loss.  Respiratory:  Negative for cough, shortness of breath and wheezing.    Cardiovascular:  Negative for chest pain, palpitations and leg swelling.  Gastrointestinal:  Negative for abdominal pain and blood in stool.  Genitourinary:  Negative for dysuria.  Musculoskeletal:  Negative for myalgias.  Skin:  Negative for rash.  Neurological:  Negative for dizziness and headaches.  Endo/Heme/Allergies:  Does not bruise/bleed easily.  Psychiatric/Behavioral:  Negative for depression. The patient is nervous/anxious.      OBJECTIVE Physical Exam: Vitals:   05/12/22 1100  BP: (!) 150/85  Pulse: 80  Weight: 181 lb (82.1 kg)  Height: 5' 3.5" (1.613 m)    Physical Exam Constitutional:      General: She is not in acute distress. Pulmonary:     Effort: Pulmonary effort is normal.  Abdominal:     General: There is no distension.     Palpations: Abdomen is soft.     Tenderness: There is no abdominal tenderness. There is no rebound.  Musculoskeletal:        General: No swelling. Normal range of motion.  Skin:    General: Skin is warm and dry.     Findings: No rash.  Neurological:     Mental Status: She is alert and oriented to person, place, and time.  Psychiatric:        Mood and Affect: Mood normal.        Behavior: Behavior normal.      GU / Detailed Urogynecologic Evaluation:  Pelvic Exam: Normal external female genitalia; Bartholin's and Skene's glands normal in appearance; urethral meatus normal in appearance, no urethral masses or discharge.   CST: negative  Speculum exam reveals normal vaginal mucosa with atrophy. Cervix normal appearance. Uterus normal single, nontender. Adnexa no mass, fullness, tenderness.  No vaginal mesh visualized or palpated.    Pelvic floor strength I/V  Pelvic floor musculature: Right levator non-tender, Right obturator non-tender, Left levator non-tender, Left obturator non-tender  POP-Q:   POP-Q  -1                                            Aa   -1                                           Ba  -9  C   2                                            Gh  4.5                                            Pb  10.5                                            tvl   2.5                                            Ap  2.5                                            Bp  -10.5                                              D     Rectal Exam:  Normal external rectum  Post-Void Residual (PVR) by Bladder Scan: In order to evaluate bladder emptying, we discussed obtaining a postvoid residual and she agreed to this procedure.  Procedure: The ultrasound unit was placed on the patient's abdomen in the suprapubic region after the patient had voided. A PVR of 23 ml was obtained by bladder scan.  Laboratory Results: POC urine: positive protein, otherwise negative  ASSESSMENT AND PLAN Jordan Joseph is a 66 y.o. with:  1. Overactive bladder   2. Urinary frequency   3. Prolapse of anterior vaginal wall    OAB - We discussed the symptoms of overactive bladder (OAB), which include urinary urgency, urinary frequency, nocturia, with or without urge incontinence.  While we do not know the exact etiology of OAB, several treatment options exist. We discussed management including behavioral therapy (decreasing bladder irritants, urge suppression strategies, timed voids, bladder retraining), physical therapy, medication; for refractory cases posterior tibial nerve stimulation, sacral neuromodulation, and intravesical botulinum toxin injection.  - Prescribed Trospium 60mg  ER daily. For anticholinergic medications, we discussed the potential side effects of anticholinergics including dry eyes, dry mouth, constipation, cognitive impairment and urinary retention. - Referral also placed to pelvic floor PT - She will work on reducing bladder irritants, specifically coffee and soda. List provided.  - Also reviewed importance of good blood sugar control in decreasing urinary frequency.  She has not had an A1c since last year (was 10% at that time), but she is seeing her endocrinologist next month.   2. Stage II anterior, Stage I posterior, Stage I apical prolapse - She is asymptomatic from her prolapse, therefore can expectantly manage.    Return 6 weeks for follow up   , MD

## 2022-05-27 ENCOUNTER — Ambulatory Visit (INDEPENDENT_AMBULATORY_CARE_PROVIDER_SITE_OTHER): Payer: Medicare HMO

## 2022-05-27 ENCOUNTER — Ambulatory Visit (HOSPITAL_BASED_OUTPATIENT_CLINIC_OR_DEPARTMENT_OTHER)
Admission: RE | Admit: 2022-05-27 | Discharge: 2022-05-27 | Disposition: A | Discharge status: Home / Self Care | Payer: Medicare HMO | Source: Ambulatory Visit | Attending: Family Medicine | Admitting: Family Medicine

## 2022-05-27 DIAGNOSIS — I10 Essential (primary) hypertension: Secondary | ICD-10-CM | POA: Insufficient documentation

## 2022-05-27 DIAGNOSIS — R011 Cardiac murmur, unspecified: Secondary | ICD-10-CM | POA: Insufficient documentation

## 2022-05-27 NOTE — Progress Notes (Signed)
  Echocardiogram 2D Echocardiogram has been performed.  Jordan Joseph F 05/27/2022, 4:04 PM

## 2022-05-27 NOTE — Progress Notes (Signed)
Pt here for Blood pressure check per Dr Laury Axon: 05/05/22: "Return in about 2 weeks (around 05/19/2022), or if symptoms worsen or fail to improve, for hypertension--nurse visit ok---- then 3 month f/u."  Pt currently takes: Hyzaar 100-25 mg daily, Catapres  0.1 mg BID, Amlodipine 10 mg daily, Carvedilol 1 mg BID.   Pt reports compliance with medication.  BP today @ = 132/80 HR = 76  Pt advised per Dr Laury Axon to continue her continue regimen and keep scheduled 3 month follow up. Pt aware and voices understanding.

## 2022-05-28 LAB — ECHOCARDIOGRAM COMPLETE
AR max vel: 2.22 cm2
AV Area VTI: 2.21 cm2
AV Area mean vel: 2.05 cm2
AV Mean grad: 4 mmHg
AV Peak grad: 6.1 mmHg
Ao pk vel: 1.23 m/s
Area-P 1/2: 4.19 cm2
S' Lateral: 3 cm

## 2022-06-06 ENCOUNTER — Encounter: Payer: Self-pay | Admitting: Internal Medicine

## 2022-06-06 ENCOUNTER — Ambulatory Visit: Payer: Medicare HMO | Admitting: Internal Medicine

## 2022-06-06 VITALS — BP 144/90 | HR 80 | Ht 63.5 in | Wt 184.0 lb

## 2022-06-06 DIAGNOSIS — E1165 Type 2 diabetes mellitus with hyperglycemia: Secondary | ICD-10-CM

## 2022-06-06 LAB — POCT GLYCOSYLATED HEMOGLOBIN (HGB A1C): Hemoglobin A1C: 11.1 % — AB (ref 4.0–5.6)

## 2022-06-06 LAB — POCT GLUCOSE (DEVICE FOR HOME USE): POC Glucose: 434 mg/dl — AB (ref 70–99)

## 2022-06-06 MED ORDER — BD PEN NEEDLE MICRO U/F 32G X 6 MM MISC: 1.0000 | Freq: Every day | 3 refills | Status: DC

## 2022-06-06 MED ORDER — TRESIBA FLEXTOUCH 100 UNIT/ML ~~LOC~~ SOPN: 30.0000 [IU] | PEN_INJECTOR | Freq: Every day | SUBCUTANEOUS | 3 refills | Status: DC

## 2022-06-06 MED ORDER — DAPAGLIFLOZIN PROPANEDIOL 10 MG PO TABS: 10.0000 mg | ORAL_TABLET | Freq: Every day | ORAL | 3 refills | Status: DC

## 2022-06-06 MED ORDER — DEXCOM G7 SENSOR MISC: 1.0000 | 3 refills | Status: DC

## 2022-06-06 NOTE — Patient Instructions (Addendum)
-   Stop Metformin  - Increase Farxiga 10 mg, 1 tablet daily  - Increase tresiba 30 units daily    HOW TO TREAT LOW BLOOD SUGARS (Blood sugar LESS THAN 70 MG/DL) Please follow the RULE OF 15 for the treatment of hypoglycemia treatment (when your (blood sugars are less than 70 mg/dL)   STEP 1: Take 15 grams of carbohydrates when your blood sugar is low, which includes:  3-4 GLUCOSE TABS  OR 3-4 OZ OF JUICE OR REGULAR SODA OR ONE TUBE OF GLUCOSE GEL    STEP 2: RECHECK blood sugar in 15 MINUTES STEP 3: If your blood sugar is still low at the 15 minute recheck --> then, go back to STEP 1 and treat AGAIN with another 15 grams of carbohydrates.

## 2022-06-06 NOTE — Progress Notes (Unsigned)
Name: Jordan Joseph  Age/ Sex: 66 y.o., female   MRN/ DOB: [id number], 03-Nov-1955     PCP: Donato Schultz, DO   Reason for Endocrinology Evaluation: Type 2 Diabetes Mellitus  Initial Endocrine Consultative Visit: 03/06/2020    PATIENT IDENTIFIER: Jordan Joseph is a 66 y.o. female with a past medical history of HTN and T2DM. The patient has followed with Endocrinology clinic since 03/06/2020 for consultative assistance with management of her diabetes.  DIABETIC HISTORY:  Jordan Joseph was diagnosed with T2DM in 2013. Marland Kitchen Her hemoglobin A1c has ranged from 11.1% in 04/2020, peaking at 13.5 % in 2021  ON her initial visit she was on basal insulin and Glipizide. We stopped Glipizide, started Metformin and increased basal insulin   SUBJECTIVE:   During the last visit (06/25/2021): A1c 10.0% . We continued Metformin and  basal insulin      Today (06/06/2022): Jordan Joseph is here for a follow up on diabetes management.She has NOT been to our clinic in a year.   She checks her blood sugars occasionally   Denies nausea , vomiting or diarrhea  She has graham crackers, and chicken sandwich  She admits to medication non adherence  She has anxiety from medication   She is interested in CGM technology    HOME DIABETES REGIMEN:  Metformin 500 mg XR, 2 tablet BID- she only takes 2 total daily  Tresiba 26 units daily - takes 24 units  Farxiga 5 mg daily      Statin: Yes ACE-I/ARB: Yes     GLUCOSE LOG:  155 - 242 mg/dL    DIABETIC COMPLICATIONS: Microvascular complications:   Denies: CKD, retinopathy, neuropathy  Last Eye Exam: Completed 2020  Macrovascular complications:   Denies: CAD, CVA, PVD   HISTORY:  Past Medical History:  Past Medical History:  Diagnosis Date   Diabetes mellitus type II, uncontrolled    HTN (hypertension)    Past Surgical History:  Past Surgical History:  Procedure Laterality Date   CHOLECYSTECTOMY     COLONOSCOPY  10/27/2013   [name].  [name], MD. left sided diverticulosis. O/W normar screening colonoscopy.   Suburethral sling     For incontinence- 2000   Social History:  reports that she has never smoked. She has never used smokeless tobacco. She reports that she does not drink alcohol and does not use drugs. Family History:  Family History  Problem Relation Age of Onset   Alzheimer's disease Mother    Hypertension Father    Cirrhosis Father    Diabetes Mellitus II Father    Diabetes Brother    Hypertension Brother    Colon cancer Neg Hx    Rectal cancer Neg Hx    Pancreatic cancer Neg Hx    Stomach cancer Neg Hx    Esophageal cancer Neg Hx    Liver cancer Neg Hx      HOME MEDICATIONS: Allergies as of 06/06/2022   No Known Allergies      Medication List        Accurate as of June 06, 2022  1:30 PM. If you have any questions, ask your nurse or doctor.          amLODipine 10 MG tablet Commonly known as: NORVASC Take 1 tablet (10 mg total) by mouth daily.   carvedilol 25 MG tablet Commonly known as: COREG Take 1 tablet (25 mg total) by mouth 2 (two) times daily with a meal.   cloNIDine 0.1 MG tablet Commonly  known as: CATAPRES Take 1 tablet (0.1 mg total) by mouth 2 (two) times daily.   dapagliflozin propanediol 5 MG Tabs tablet Commonly known as: Farxiga Take 1 tablet (5 mg total) by mouth daily before breakfast.   FREESTYLE LITE test strip Generic drug: glucose blood 1 each by Other route daily in the afternoon. Use as instructed   losartan-hydrochlorothiazide 100-25 MG tablet Commonly known as: HYZAAR Take 1 tablet by mouth daily.   metFORMIN 500 MG 24 hr tablet Commonly known as: GLUCOPHAGE-XR Take 2 tablets (1,000 mg total) by mouth in the morning and at bedtime.   simvastatin 20 MG tablet Commonly known as: ZOCOR Take 1 tablet (20 mg total) by mouth at bedtime.   Jordan Joseph FlexTouch 100 UNIT/ML FlexTouch Pen Generic drug: insulin degludec Inject 26 Units into the skin  daily.   Trospium Chloride 60 MG Cp24 Take 1 capsule (60 mg total) by mouth daily.         OBJECTIVE:   Vital Signs: BP (!) 144/90 (BP Location: Left Arm, Patient Position: Sitting, Cuff Size: Small)   Pulse 80   Ht 5' 3.5" (1.613 m)   Wt 184 lb (83.5 kg)   LMP  (LMP Unknown)   SpO2 95%   BMI 32.08 kg/m   Wt Readings from Last 3 Encounters:  06/06/22 184 lb (83.5 kg)  05/12/22 181 lb (82.1 kg)  05/05/22 181 lb 9.6 oz (82.4 kg)     Exam: General: Pt appears well and is in NAD  Lungs: Clear with good BS bilat   Heart: RRR  Extremities: No pretibial edema.   Neuro: MS is good with appropriate affect, pt is alert and Ox3    DM foot exam: 06/06/2022  The skin of the feet is intact without sores or ulcerations. The pedal pulses are 2+ on right and 2+ on left. The sensation is intact to a screening 5.07, 10 gram monofilament bilaterally    DATA REVIEWED:  Lab Results  Component Value Date   HGBA1C 11.1 (A) 06/06/2022   HGBA1C 10.0 (A) 06/25/2021   HGBA1C 8.3 (A) 01/01/2021   Lab Results  Component Value Date   MICROALBUR 28.7 (H) 06/21/2021   LDLCALC 72 10/18/2021   CREATININE 0.81 05/05/2022   Lab Results  Component Value Date   MICRALBCREAT 48.8 (H) 06/21/2021     Lab Results  Component Value Date   CHOL 191 05/05/2022   HDL 39.60 05/05/2022   LDLCALC 72 10/18/2021   LDLDIRECT 84.0 05/05/2022   TRIG 237.0 (H) 05/05/2022   CHOLHDL 5 05/05/2022         ASSESSMENT / PLAN / RECOMMENDATIONS:   1) Type 2 Diabetes Mellitus, Poorly controlled, Without complications - Most recent A1c of 11.1 %. Goal A1c < 7.0 %.    - Poorly controlled diabetes  - Has not been to our clinic in a year - She doesn't take metformin and a lot of her other meds because she is skeptical about side effect, we discussed the importance of weighing the risk vs benefit and at this time the benefit outweighs the risk  - I have suggested starting her on prandial insulin but she is  not agreeing, I have asked her to seriously consider prandial dose of insulin with each meal - Will increase farxiga and insulin  - Will refer her to our CDE per her request  -A prescription for Dexcom G7 will be faxed to her DME supplier - Will stop Metformin as she does not want  to take it , she understands this may worsen her glycemic control    MEDICATIONS: Stop  Metformin 500 mg 2 tabs BID  Increase Tresiba 30 units daily  Increase  Farxiga 10 mg , 1 tablet daily   EDUCATION / INSTRUCTIONS: BG monitoring instructions: Patient is instructed to check her blood sugars 1 times a day, fasting  Call [facility] if: BG persistently < 70  I reviewed the Rule of 15 for the treatment of hypoglycemia in detail with the patient. Literature supplied.   2) Diabetic complications:  Eye: Does not have known diabetic retinopathy.  Neuro/ Feet: Does not have known diabetic peripheral neuropathy .  Renal: Patient does not have known baseline CKD. She   is  on an ACEI/ARB at present.     F/U in 4 months    Signed electronically by: Lyndle Herrlich, MD  [facility]  [facility] [address] [place] [place], [place] [address] Phone: [phone] FAX: [phone]   CC: Virgina Organ [address] [place] [address] Phone: [phone]  Fax: [phone]  Return to Endocrinology clinic as below: Future Appointments  Date Time Provider Department Center  06/10/2022  9:30 AM Barbaraann Faster, PT [facility] None  06/24/2022  1:40 PM Marguerita Beards, MD [facility]  08/12/2022  9:15 AM [facility]  08/12/2022  2:00 PM Lowne Irish Elders, DO [facility]

## 2022-06-10 ENCOUNTER — Ambulatory Visit: Payer: Medicare HMO | Attending: Obstetrics and Gynecology | Admitting: Physical Therapy

## 2022-06-10 ENCOUNTER — Other Ambulatory Visit: Payer: Self-pay

## 2022-06-10 DIAGNOSIS — M6281 Muscle weakness (generalized): Secondary | ICD-10-CM | POA: Diagnosis present

## 2022-06-10 DIAGNOSIS — R279 Unspecified lack of coordination: Secondary | ICD-10-CM | POA: Diagnosis present

## 2022-06-10 DIAGNOSIS — R293 Abnormal posture: Secondary | ICD-10-CM | POA: Diagnosis present

## 2022-06-10 NOTE — Therapy (Signed)
OUTPATIENT PHYSICAL THERAPY FEMALE PELVIC EVALUATION   Patient Name: Jordan Joseph MRN: [id number] DOB:Feb 23, 1956, 66 y.o., female Today's Date: 06/10/2022   PT End of Session - 06/10/22 0928     Visit Number 1    Date for PT Re-Evaluation 09/09/22    Authorization Type [employer]    PT Start Time 0930    PT Stop Time 1013    PT Time Calculation (min) 43 min    Activity Tolerance Patient tolerated treatment well    Behavior During Therapy [facility] for tasks assessed/performed             Past Medical History:  Diagnosis Date   Diabetes mellitus type II, uncontrolled    HTN (hypertension)    Past Surgical History:  Procedure Laterality Date   CHOLECYSTECTOMY     COLONOSCOPY  10/27/2013   Jordan Joseph. [name], MD. left sided diverticulosis. O/W normar screening colonoscopy.   Suburethral sling     For incontinence- 2000   Patient Active Problem List   Diagnosis Date Noted   Preventative health care 10/18/2021   Hyperlipidemia associated with type 2 diabetes mellitus (HCC) 04/10/2020   Hypertension 02/17/2020   Uncontrolled type 2 diabetes mellitus with hyperglycemia (HCC) 02/17/2020   Equinus deformity of foot, acquired 05/01/2014    PCP: Donato Schultz, DO  REFERRING PROVIDER: Marguerita Beards, MD  REFERRING DIAG: N32.81 (ICD-10-CM) - Overactive bladder  THERAPY DIAG:  Muscle weakness (generalized)  Abnormal posture  Unspecified lack of coordination  Rationale for Evaluation and Treatment Rehabilitation  ONSET DATE: 3 years at least  SUBJECTIVE:                                                                                                                                                                                           SUBJECTIVE STATEMENT: Pt reports "I have an uncontrollable bladder. Once it starts I can't stop it.". Pt reports she has to use the restroom within 15 mins of drinking any fluids and has very short warning with strong  urgency.   Fluid intake: Yes: 4-5 16 oz bottles of water per day, 1 cup of coffee, sometimes 16 oz soda     PAIN:  Are you having pain? No   PRECAUTIONS: None  WEIGHT BEARING RESTRICTIONS No  FALLS:  Has patient fallen in last 6 months? No  LIVING ENVIRONMENT: Lives with: lives alone Lives in: House/apartment   OCCUPATION: minister   PLOF: Independent  PATIENT GOALS to have less leakage  PERTINENT HISTORY:  DM2, HTN, Suburethral sling 1999, CHOLECYSTECTOMY, left sided diverticulosis  Sexual abuse: No  BOWEL MOVEMENT Pain with bowel  movement: No Type of bowel movement:Type (Bristol Stool Scale) 4, Frequency daily to every other, and Strain No Fully empty rectum: Yes:   Leakage: No Pads: No Fiber supplement: No  URINATION Pain with urination: No Fully empty bladder: Yes:   Stream: Strong Urgency: Yes:   Frequency: 15 mins after drinking, but if not usually does not have urges to empty; 1-2x per night without leakage Leakage: Urge to void and Walking to the bathroom Pads: Yes: thick/large pads all the time changing 2x per day on average. At home wears smaller pads and changes 1-2x times.   INTERCOURSE Pain with intercourse:  has not been active for 19 years Not painful, but has not been active since divorce   PREGNANCY Vaginal deliveries 2 Tearing Yes: thinks she had a small tear with first C-section deliveries 0 Currently pregnant No  PROLAPSE None    OBJECTIVE:   DIAGNOSTIC FINDINGS:    COGNITION:  Overall cognitive status: Within functional limits for tasks assessed     SENSATION:  Light touch: Appears intact  Proprioception: Appears intact  MUSCLE LENGTH: Bil hamstrings and adductors limited by 50%                 POSTURE: rounded shoulders, forward head, and anterior pelvic tilt   LUMBARAROM/PROM  A/PROM A/PROM  eval  Flexion Limited by 50%  Extension WFL  Right lateral flexion Limited by 25%  Left lateral flexion Limited by  25%  Right rotation Limited by 50%  Left rotation Limited by 50%   (Blank rows = not tested)  LOWER EXTREMITY ROM:  WFL   LOWER EXTREMITY MMT:  Bil hips grossly 4/5 with exception of Lt hip abduction 3+/5, knees and ankles 5/5   PALPATION:   General  no TTP throughout abdomen, pelvis or hips or spine however did have fascial restrictions throughout all abdominal quadrants                External Perineal Exam mild dryness but no pain                              Internal Pelvic Floor no TTP  Patient confirms identification and approves PT to assess internal pelvic floor and treatment Yes  PELVIC MMT: No emotional/communication barriers or cognitive limitation. Patient is motivated to learn. Patient understands and agrees with treatment goals and plan. PT explains patient will be examined in standing, sitting, and lying down to see how their muscles and joints work. When they are ready, they will be asked to remove their underwear so PT can examine their perineum. The patient is also given the option of providing their own chaperone as one is not provided in our facility. The patient also has the right and is explained the right to defer or refuse any part of the evaluation or treatment including the internal exam. With the patient's consent, PT will use one gloved finger to gently assess the muscles of the pelvic floor, seeing how well it contracts and relaxes and if there is muscle symmetry. After, the patient will get dressed and PT and patient will discuss exam findings and plan of care. PT and patient discuss plan of care, schedule, attendance policy and HEP activities.    MMT eval  Vaginal 1/5, 1s isometric, 3 reps  Internal Anal Sphincter   External Anal Sphincter   Puborectalis   Diastasis Recti   (Blank rows = not tested)  TONE: Mildly decreased   PROLAPSE: Anterior wall laxity possible grade 2 noted in hooklying with strong cough in hooklying   TODAY'S TREATMENT   EVAL Examination completed, findings reviewed, pt educated on POC, HEP, and bladder irritants. Pt motivated to participate in PT and agreeable to attempt recommendations.     PATIENT EDUCATION:  Education details: [id number] Person educated: Patient Education method: Solicitor, Actor cues, Verbal cues, and Handouts Education comprehension: verbalized understanding and returned demonstration   HOME EXERCISE PROGRAM: [id number]  ASSESSMENT:  CLINICAL IMPRESSION: Patient is a 66 y.o. female  who was seen today for physical therapy evaluation and treatment for urinary urgency, incontinence. Pt has history of suburethral sling and bladder suspension per chart review. Pt reports she has full loss of urine with strong urgency with inability to stop urine at all. Pt reports she has to urinate 15-30 mins within drink fluid and has very short time between urge and loss of bladder. If driving and urge starts when she puts feet out of the car she will have loss of bladder fairly regularly because of this tries to limit fluids if she knows she will be driving or Joseph errands. Pt found to have decreased flexibility in bil spine and hips, decreased core and hip strength, fascial restrictions at abdomen without pain. Pt consented to internal vaginal assessment this date and found to have decreased strength, coordination, and endurance.  Pt demonstrated poor activation of pelvic floor muscles, with cues for breathing mechanics and technique did increase to 2/5 however only 1-2 reps. Pt reports she was able to feel this contraction with focus and improved mechanics in hooklying. Pt given HEP and reviewed this with her. Pt would benefit from additional PT to further address deficits.     OBJECTIVE IMPAIRMENTS decreased coordination, decreased endurance, decreased strength, increased fascial restrictions, impaired flexibility, improper body mechanics, and postural dysfunction.   ACTIVITY  LIMITATIONS continence  PARTICIPATION LIMITATIONS: community activity  PERSONAL FACTORS Fitness, Time since onset of injury/illness/exacerbation, and 1 comorbidity: medical history  are also affecting patient's functional outcome.   REHAB POTENTIAL: Good  CLINICAL DECISION MAKING: Stable/uncomplicated  EVALUATION COMPLEXITY: Low   GOALS: Goals reviewed with patient? Yes  SHORT TERM GOALS: Target date: 07/08/2022  Pt to be I with HEP.  Baseline: Goal status: INITIAL  2.  Pt to demonstrate at least 2/5 pelvic floor strength consistently for improved pelvic stability and decreased strain at pelvic floor/ decrease leakage.  Baseline:  Goal status: INITIAL  3.  Pt to report improved time between bladder voids to at least 1 hour for improved QOL with decreased urinary frequency.   Baseline:  Goal status: INITIAL  4.  Pt will have 25% less urgency due to bladder retraining and strengthening  Baseline:  Goal status: INITIAL    LONG TERM GOALS: Target date: 09/09/22  Pt to be I with advanced HEP.  Baseline:  Goal status: INITIAL  2.  Pt to demonstrate at least 5/5 bil hip strength for improved pelvic stability and functional squats without leakage.  Baseline:  Goal status: INITIAL  3.  Pt to demonstrate at least 4/5 pelvic floor strength for improved pelvic stability and decreased strain at pelvic floor/ decrease leakage.  Baseline:  Goal status: INITIAL  4.  Pt will have 50% less urgency due to bladder retraining and strengthening  Baseline:  Goal status: INITIAL  5.  Pt will have to use 1 pads per day in no longer than size 2 due to decreased symptoms.  Baseline:  Goal status: INITIAL  6.  Pt to demonstrate improved coordination of pelvic floor and breathing mechanics during squatting 15# without leakage to improve pelvic stability and increase pelvic floor strength.  Baseline:  Goal status: INITIAL  PLAN: PT FREQUENCY: 1x/week  PT DURATION:  8  sessions  PLANNED INTERVENTIONS: Therapeutic exercises, Therapeutic activity, Neuromuscular re-education, Patient/Family education, Self Care, Joint mobilization, Dry Needling, Spinal mobilization, Cryotherapy, Moist heat, scar mobilization, Taping, Vasopneumatic device, Biofeedback, and Manual therapy  PLAN FOR NEXT SESSION: coordination of pelvic floor and breathing mechanics with exercises, strengthening pelvic floor, core, hips, bladder retraining, urge drill    Otelia Sergeant, PT, DPT 06/10/2309:24 AM

## 2022-06-10 NOTE — Patient Instructions (Signed)

## 2022-06-24 ENCOUNTER — Ambulatory Visit: Payer: Medicare HMO | Admitting: Obstetrics and Gynecology

## 2022-06-24 ENCOUNTER — Encounter: Payer: Self-pay | Admitting: Obstetrics and Gynecology

## 2022-06-24 VITALS — BP 152/90 | HR 86

## 2022-06-24 DIAGNOSIS — N3281 Overactive bladder: Secondary | ICD-10-CM | POA: Diagnosis not present

## 2022-06-24 NOTE — Progress Notes (Signed)
[facility] Return Visit  SUBJECTIVE  History of Present Illness: Jordan Joseph is a 66 y.o. female seen in follow-up for overactive bladder. Plan at last visit was to start trospium 60mg  ER daily and referral was placed to pelvic PT.   She feels that she has more time to get to the bathroom and leaking less. She has decreased the amount that she is going to the bathroom during the day. She is waking 1-2 times per night. She has cut back on coffee and soda and drinking 5 bottles of water (hint). She has noticed that tea affects her a lot so she does not drink.  Has some dry mouth but not every day and not bothersome to her.   She had one appt with PT and is doing some pelvic PT exercises at home. She is working on the exercises before she gets out of the car and that helps her to get to the bathroom.   Last hgb A1c was 11.1 on 06/06/22. She is working on her blood sugars but has a lot of family stress currently.   Past Medical History: Patient  has a past medical history of Diabetes mellitus type II, uncontrolled and HTN (hypertension).   Past Surgical History: She  has a past surgical history that includes Colonoscopy (10/27/2013); Cholecystectomy; and Suburethral sling.   Medications: She has a current medication list which includes the following prescription(s): amlodipine, carvedilol, clonidine, dexcom g7 sensor, dapagliflozin propanediol, freestyle lite, tresiba flextouch, bd pen needle micro u/f, losartan-hydrochlorothiazide, simvastatin, and trospium chloride.   Allergies: Patient has No Known Allergies.   Social History: Patient  reports that she has never smoked. She has never used smokeless tobacco. She reports that she does not drink alcohol and does not use drugs.      OBJECTIVE     Physical Exam: Vitals:   06/24/22 1352  BP: (!) 152/90  Pulse: 86   Gen: No apparent distress, A&O x 3.  Detailed Urogynecologic Evaluation:  Deferred. Prior exam  showed:  POP-Q (05/12/22):    POP-Q   -1                                            Aa   -1                                           Ba   -9                                              C    2                                            Gh   4.5                                            Pb   10.5  tvl    2.5                                            Ap   2.5                                            Bp   -10.5                                              D        ASSESSMENT AND PLAN    Ms. Fulp is a 66 y.o. with:  1. Overactive bladder     - Continue with Trospium 60mg  ER daily - Continue with pelvic PT  Return 1 year or sooner if needed.  Jaquita Folds, MD   Time spent: I spent 20 minutes dedicated to the care of this patient on the date of this encounter to include pre-visit review of records, face-to-face time with the patient and post visit documentation.

## 2022-07-15 ENCOUNTER — Ambulatory Visit: Payer: Medicare HMO | Attending: Obstetrics and Gynecology | Admitting: Physical Therapy

## 2022-07-15 DIAGNOSIS — R279 Unspecified lack of coordination: Secondary | ICD-10-CM | POA: Insufficient documentation

## 2022-07-15 DIAGNOSIS — M6281 Muscle weakness (generalized): Secondary | ICD-10-CM | POA: Diagnosis not present

## 2022-07-15 NOTE — Therapy (Signed)
OUTPATIENT PHYSICAL THERAPY FEMALE PELVIC EVALUATION   Patient Name: Jordan Joseph MRN: [id number] DOB:11/18/1955, 66 y.o., female Today's Date: 07/15/2022   PT End of Session - 07/15/22 0935     Visit Number 2    Date for PT Re-Evaluation 09/09/22    Authorization Type [employer]    PT Start Time [id number]    PT Stop Time 1010    PT Time Calculation (min) 39 min    Activity Tolerance Patient tolerated treatment well    Behavior During Therapy [facility] for tasks assessed/performed             Past Medical History:  Diagnosis Date   Diabetes mellitus type II, uncontrolled    HTN (hypertension)    Past Surgical History:  Procedure Laterality Date   CHOLECYSTECTOMY     COLONOSCOPY  10/27/2013   [name], MD. left sided diverticulosis. O/W normar screening colonoscopy.   Suburethral sling     For incontinence- 2000   Patient Active Problem List   Diagnosis Date Noted   Preventative health care 10/18/2021   Hyperlipidemia associated with type 2 diabetes mellitus (HCC) 04/10/2020   Hypertension 02/17/2020   Uncontrolled type 2 diabetes mellitus with hyperglycemia (HCC) 02/17/2020   Equinus deformity of foot, acquired 05/01/2014    PCP: Donato Schultz, DO  REFERRING PROVIDER: Marguerita Beards, MD  REFERRING DIAG: N32.81 (ICD-10-CM) - Overactive bladder  THERAPY DIAG:  Muscle weakness (generalized)  Unspecified lack of coordination  Rationale for Evaluation and Treatment Rehabilitation  ONSET DATE: 3 years at least  SUBJECTIVE:                                                                                                                                                                                           SUBJECTIVE STATEMENT: Pt reports frequency has improved since starting medicine and now every few hours, urge drill has been helpful with urgency and had no leakage. Getting up one time per night.   Fluid intake: Yes: 4-5 16 oz bottles of  water per day, 1 cup of coffee, sometimes 16 oz soda     PAIN:  Are you having pain? No   PRECAUTIONS: None  WEIGHT BEARING RESTRICTIONS No  FALLS:  Has patient fallen in last 6 months? No  LIVING ENVIRONMENT: Lives with: lives alone Lives in: House/apartment   OCCUPATION: minister   PLOF: Independent  PATIENT GOALS to have less leakage  PERTINENT HISTORY:  DM2, HTN, Suburethral sling 1999, CHOLECYSTECTOMY, left sided diverticulosis  Sexual abuse: No  BOWEL MOVEMENT Pain with bowel movement: No Type of bowel movement:Type (Bristol Stool Scale) 4,  Frequency daily to every other, and Strain No Fully empty rectum: Yes:   Leakage: No Pads: No Fiber supplement: No  URINATION Pain with urination: No Fully empty bladder: Yes:   Stream: Strong Urgency: Yes:   Frequency: 15 mins after drinking, but if not usually does not have urges to empty; 1-2x per night without leakage Leakage: Urge to void and Walking to the bathroom Pads: Yes: thick/large pads all the time changing 2x per day on average. At home wears smaller pads and changes 1-2x times.   INTERCOURSE Pain with intercourse:  has not been active for 19 years Not painful, but has not been active since divorce   PREGNANCY Vaginal deliveries 2 Tearing Yes: thinks she had a small tear with first C-section deliveries 0 Currently pregnant No  PROLAPSE None    OBJECTIVE:   DIAGNOSTIC FINDINGS:    COGNITION:  Overall cognitive status: Within functional limits for tasks assessed     SENSATION:  Light touch: Appears intact  Proprioception: Appears intact  MUSCLE LENGTH: Bil hamstrings and adductors limited by 50%                 POSTURE: rounded shoulders, forward head, and anterior pelvic tilt   LUMBARAROM/PROM  A/PROM A/PROM  eval  Flexion Limited by 50%  Extension WFL  Right lateral flexion Limited by 25%  Left lateral flexion Limited by 25%  Right rotation Limited by 50%  Left rotation  Limited by 50%   (Blank rows = not tested)  LOWER EXTREMITY ROM:  WFL   LOWER EXTREMITY MMT:  Bil hips grossly 4/5 with exception of Lt hip abduction 3+/5, knees and ankles 5/5   PALPATION:   General  no TTP throughout abdomen, pelvis or hips or spine however did have fascial restrictions throughout all abdominal quadrants                External Perineal Exam mild dryness but no pain                              Internal Pelvic Floor no TTP  Patient confirms identification and approves PT to assess internal pelvic floor and treatment Yes  PELVIC MMT:    MMT eval  Vaginal 1/5, 1s isometric, 3 reps  Internal Anal Sphincter   External Anal Sphincter   Puborectalis   Diastasis Recti   (Blank rows = not tested)        TONE: Mildly decreased   PROLAPSE: Anterior wall laxity possible grade 2 noted in hooklying with strong cough in hooklying   TODAY'S TREATMENT   07/15/2022: NMRE: all exercises cued for breathing mechanics and pelvic floor coordination  Bridges 2x10 TA activations with ball squeeze in hands 2x15 Sidelying hip abduction with ball press 2x20 each 2x10 Sit to stand from mat table lowest height    PATIENT EDUCATION:  Education details: [email or web address] Person educated: Patient Education method: Programmer, multimedia, Demonstration, Actor cues, Verbal cues, and Handouts Education comprehension: verbalized understanding and returned demonstration   HOME EXERCISE PROGRAM: [email or web address]  ASSESSMENT:  CLINICAL IMPRESSION: Patient session focused on hip and core strengthening with coordination of pelvic floor and breathing mechanics to improve pelvic floor strength and coordination for decreased bladder deficits.  Pt given HEP updated and reviewed this with her. Pt tolerated well but did need max cues for coordination. Pt would benefit from additional PT to further address deficits.     OBJECTIVE IMPAIRMENTS decreased coordination, decreased  endurance, decreased strength,  increased fascial restrictions, impaired flexibility, improper body mechanics, and postural dysfunction.   ACTIVITY LIMITATIONS continence  PARTICIPATION LIMITATIONS: community activity  PERSONAL FACTORS Fitness, Time since onset of injury/illness/exacerbation, and 1 comorbidity: medical history  are also affecting patient's functional outcome.   REHAB POTENTIAL: Good  CLINICAL DECISION MAKING: Stable/uncomplicated  EVALUATION COMPLEXITY: Low   GOALS: Goals reviewed with patient? Yes  SHORT TERM GOALS: Target date: 07/08/2022  Pt to be I with HEP.  Baseline: Goal status: INITIAL  2.  Pt to demonstrate at least 2/5 pelvic floor strength consistently for improved pelvic stability and decreased strain at pelvic floor/ decrease leakage.  Baseline:  Goal status: INITIAL  3.  Pt to report improved time between bladder voids to at least 1 hour for improved QOL with decreased urinary frequency.   Baseline:  Goal status: INITIAL  4.  Pt will have 25% less urgency due to bladder retraining and strengthening  Baseline:  Goal status: INITIAL    LONG TERM GOALS: Target date: 09/09/22  Pt to be I with advanced HEP.  Baseline:  Goal status: INITIAL  2.  Pt to demonstrate at least 5/5 bil hip strength for improved pelvic stability and functional squats without leakage.  Baseline:  Goal status: INITIAL  3.  Pt to demonstrate at least 4/5 pelvic floor strength for improved pelvic stability and decreased strain at pelvic floor/ decrease leakage.  Baseline:  Goal status: INITIAL  4.  Pt will have 50% less urgency due to bladder retraining and strengthening  Baseline:  Goal status: INITIAL  5.  Pt will have to use 1 pads per day in no longer than size 2 due to decreased symptoms.  Baseline:  Goal status: INITIAL  6.  Pt to demonstrate improved coordination of pelvic floor and breathing mechanics during squatting 15# without leakage to improve pelvic stability and increase  pelvic floor strength.  Baseline:  Goal status: INITIAL  PLAN: PT FREQUENCY: 1x/week  PT DURATION:  8 sessions  PLANNED INTERVENTIONS: Therapeutic exercises, Therapeutic activity, Neuromuscular re-education, Patient/Family education, Self Care, Joint mobilization, Dry Needling, Spinal mobilization, Cryotherapy, Moist heat, scar mobilization, Taping, Vasopneumatic device, Biofeedback, and Manual therapy  PLAN FOR NEXT SESSION: coordination of pelvic floor and breathing mechanics with exercises, strengthening pelvic floor, core, hips, bladder retraining, urge drill    Stacy Gardner, PT, DPT 07/15/2309:10 AM

## 2022-07-22 ENCOUNTER — Ambulatory Visit: Payer: Medicare HMO | Admitting: Physical Therapy

## 2022-07-29 ENCOUNTER — Ambulatory Visit: Payer: Medicare HMO | Admitting: Physical Therapy

## 2022-07-29 DIAGNOSIS — R279 Unspecified lack of coordination: Secondary | ICD-10-CM | POA: Diagnosis not present

## 2022-07-29 DIAGNOSIS — M6281 Muscle weakness (generalized): Secondary | ICD-10-CM

## 2022-07-29 NOTE — Therapy (Signed)
OUTPATIENT PHYSICAL THERAPY FEMALE PELVIC EVALUATION   Patient Name: Jordan Joseph MRN: [id number] DOB:August 23, 1956, 66 y.o., female Today's Date: 07/29/2022   PT End of Session - 07/29/22 1008     Visit Number 3    Date for PT Re-Evaluation 09/09/22    Authorization Type [employer]    PT Start Time 0930    PT Stop Time 4098    PT Time Calculation (min) 38 min    Activity Tolerance Patient tolerated treatment well    Behavior During Therapy [facility] for tasks assessed/performed              Past Medical History:  Diagnosis Date   Diabetes mellitus type II, uncontrolled    HTN (hypertension)    Past Surgical History:  Procedure Laterality Date   CHOLECYSTECTOMY     COLONOSCOPY  10/27/2013   Wynona Neat. [name], MD. left sided diverticulosis. O/W normar screening colonoscopy.   Suburethral sling     For incontinence- 2000   Patient Active Problem List   Diagnosis Date Noted   Preventative health care 10/18/2021   Hyperlipidemia associated with type 2 diabetes mellitus ([place]) 04/10/2020   Hypertension 02/17/2020   Uncontrolled type 2 diabetes mellitus with hyperglycemia ([place]) 02/17/2020   Equinus deformity of foot, acquired 05/01/2014    PCP: Ann Held, DO  REFERRING PROVIDER: Jaquita Folds, MD  REFERRING DIAG: N32.81 (ICD-10-CM) - Overactive bladder  THERAPY DIAG:  Muscle weakness (generalized)  Unspecified lack of coordination  Rationale for Evaluation and Treatment Rehabilitation  ONSET DATE: 3 years at least  SUBJECTIVE:                                                                                                                                                                                           SUBJECTIVE STATEMENT: Pt reports she is able to make it to the bathroom sometimes but not always, depending on what she is drinking or how much. Water is more frequently, tea is major irritant so that has been greatly limited. With leakage  it is full loss of urine. Only had 2 instances of leakage in the past week, both with drinking 32 oz of water after preaching very quickly. Can make it into places without leakages now which has been really helpful.   Fluid intake: Yes: 4-5 16 oz bottles of water per day, 1 cup of coffee, sometimes 16 oz soda     PAIN:  Are you having pain? No   PRECAUTIONS: None  WEIGHT BEARING RESTRICTIONS No  FALLS:  Has patient fallen in last 6 months? No  LIVING ENVIRONMENT: Lives with: lives alone  Lives in: House/apartment   OCCUPATION: minister   PLOF: Independent  PATIENT GOALS to have less leakage  PERTINENT HISTORY:  DM2, HTN, Suburethral sling 1999, CHOLECYSTECTOMY, left sided diverticulosis  Sexual abuse: No  BOWEL MOVEMENT Pain with bowel movement: No Type of bowel movement:Type (Bristol Stool Scale) 4, Frequency daily to every other, and Strain No Fully empty rectum: Yes:   Leakage: No Pads: No Fiber supplement: No  URINATION Pain with urination: No Fully empty bladder: Yes:   Stream: Strong Urgency: Yes:   Frequency: 15 mins after drinking, but if not usually does not have urges to empty; 1-2x per night without leakage Leakage: Urge to void and Walking to the bathroom Pads: Yes: thick/large pads all the time changing 2x per day on average. At home wears smaller pads and changes 1-2x times.   INTERCOURSE Pain with intercourse:  has not been active for 19 years Not painful, but has not been active since divorce   PREGNANCY Vaginal deliveries 2 Tearing Yes: thinks she had a small tear with first C-section deliveries 0 Currently pregnant No  PROLAPSE None    OBJECTIVE:   DIAGNOSTIC FINDINGS:    COGNITION:  Overall cognitive status: Within functional limits for tasks assessed     SENSATION:  Light touch: Appears intact  Proprioception: Appears intact  MUSCLE LENGTH: Bil hamstrings and adductors limited by 50%                 POSTURE: rounded  shoulders, forward head, and anterior pelvic tilt   LUMBARAROM/PROM  A/PROM A/PROM  eval  Flexion Limited by 50%  Extension WFL  Right lateral flexion Limited by 25%  Left lateral flexion Limited by 25%  Right rotation Limited by 50%  Left rotation Limited by 50%   (Blank rows = not tested)  LOWER EXTREMITY ROM:  WFL   LOWER EXTREMITY MMT:  Bil hips grossly 4/5 with exception of Lt hip abduction 3+/5, knees and ankles 5/5   PALPATION:   General  no TTP throughout abdomen, pelvis or hips or spine however did have fascial restrictions throughout all abdominal quadrants                External Perineal Exam mild dryness but no pain                              Internal Pelvic Floor no TTP  Patient confirms identification and approves PT to assess internal pelvic floor and treatment Yes  PELVIC MMT:    MMT eval  Vaginal 1/5, 1s isometric, 3 reps  Internal Anal Sphincter   External Anal Sphincter   Puborectalis   Diastasis Recti   (Blank rows = not tested)        TONE: Mildly decreased   PROLAPSE: Anterior wall laxity possible grade 2 noted in hooklying with strong cough in hooklying   TODAY'S TREATMENT   07/29/2022: NMRE: all exercises cued for breathing mechanics and pelvic floor coordination for improved mechanics with pt strengthening and coordination to decrease leakage. X10 diaphragmatic breathing in hooklying with ball press to help coordinate pelvic floor activation  X10 diaphragmatic breathing with pelvic floor contractions and TA activation with use of ball press as well X10 Hooklying marching with ball press for coordination of TA and pelvic floor with breathing 2X10 hooklying ball press with chest pressing ball at heart height X10 bird dogs  2X10 Sit to stand from mat table> progressed to this  with addition of green band bil shoulder horizontal abduction x10  07/15/2022: NMRE: all exercises cued for breathing mechanics and pelvic floor coordination   Bridges 2x10 TA activations with ball squeeze in hands 2x15 Sidelying hip abduction with ball press 2x20 each 2x10 Sit to stand from mat table lowest height    PATIENT EDUCATION:  Education details: [email or web address] Person educated: Patient Education method: Consulting civil engineer, Demonstration, Tactile cues, Verbal cues, and Handouts Education comprehension: verbalized understanding and returned demonstration   HOME EXERCISE PROGRAM: [email or web address]  ASSESSMENT:  CLINICAL IMPRESSION: Patient session focused on hip and core strengthening with coordination of pelvic floor and breathing mechanics to improve pelvic floor strength and coordination for decreased bladder deficits. Increased challenge today with more functional core strengthening with maintaining coordination. Pt tolerated well but did need moderate cues for coordination. Pt would benefit from additional PT to further address deficits.     OBJECTIVE IMPAIRMENTS decreased coordination, decreased endurance, decreased strength, increased fascial restrictions, impaired flexibility, improper body mechanics, and postural dysfunction.   ACTIVITY LIMITATIONS continence  PARTICIPATION LIMITATIONS: community activity  PERSONAL FACTORS Fitness, Time since onset of injury/illness/exacerbation, and 1 comorbidity: medical history  are also affecting patient's functional outcome.   REHAB POTENTIAL: Good  CLINICAL DECISION MAKING: Stable/uncomplicated  EVALUATION COMPLEXITY: Low   GOALS: Goals reviewed with patient? Yes  SHORT TERM GOALS: Target date: 07/08/2022  Pt to be I with HEP.  Baseline: Goal status: MET  2.  Pt to demonstrate at least 2/5 pelvic floor strength consistently for improved pelvic stability and decreased strain at pelvic floor/ decrease leakage.  Baseline:  Goal status: on going  3.  Pt to report improved time between bladder voids to at least 1 hour for improved QOL with decreased urinary frequency.   Baseline:  Goal  status: MET  4.  Pt will have 25% less urgency due to bladder retraining and strengthening  Baseline:  Goal status: MET    LONG TERM GOALS: Target date: 09/09/22  Pt to be I with advanced HEP.  Baseline:  Goal status: INITIAL  2.  Pt to demonstrate at least 5/5 bil hip strength for improved pelvic stability and functional squats without leakage.  Baseline:  Goal status: INITIAL  3.  Pt to demonstrate at least 4/5 pelvic floor strength for improved pelvic stability and decreased strain at pelvic floor/ decrease leakage.  Baseline:  Goal status: INITIAL  4.  Pt will have 50% less urgency due to bladder retraining and strengthening  Baseline:  Goal status: INITIAL  5.  Pt will have to use 1 pads per day in no longer than size 2 due to decreased symptoms.  Baseline:  Goal status: INITIAL  6.  Pt to demonstrate improved coordination of pelvic floor and breathing mechanics during squatting 15# without leakage to improve pelvic stability and increase pelvic floor strength.  Baseline:  Goal status: INITIAL  PLAN: PT FREQUENCY: 1x/week  PT DURATION:  8 sessions  PLANNED INTERVENTIONS: Therapeutic exercises, Therapeutic activity, Neuromuscular re-education, Patient/Family education, Self Care, Joint mobilization, Dry Needling, Spinal mobilization, Cryotherapy, Moist heat, scar mobilization, Taping, Vasopneumatic device, Biofeedback, and Manual therapy  PLAN FOR NEXT SESSION: coordination of pelvic floor and breathing mechanics with exercises, strengthening pelvic floor, core, hips, bladder retraining, urge drill    Stacy Gardner, PT, DPT 07/29/2309:09 AM

## 2022-08-05 ENCOUNTER — Ambulatory Visit: Payer: Medicare HMO | Attending: Obstetrics and Gynecology | Admitting: Physical Therapy

## 2022-08-05 ENCOUNTER — Ambulatory Visit: Payer: 59 | Admitting: Obstetrics and Gynecology

## 2022-08-05 DIAGNOSIS — R279 Unspecified lack of coordination: Secondary | ICD-10-CM | POA: Diagnosis not present

## 2022-08-05 DIAGNOSIS — R293 Abnormal posture: Secondary | ICD-10-CM | POA: Diagnosis not present

## 2022-08-05 DIAGNOSIS — M6281 Muscle weakness (generalized): Secondary | ICD-10-CM | POA: Diagnosis not present

## 2022-08-05 NOTE — Therapy (Signed)
OUTPATIENT PHYSICAL THERAPY FEMALE PELVIC EVALUATION   Patient Name: Jordan Joseph MRN: [id number] DOB:September 11, 1956, 66 y.o., female Today's Date: 08/05/2022   PT End of Session - 08/05/22 0949     Visit Number 4    Date for PT Re-Evaluation 09/09/22    Authorization Type [employer]    PT Start Time 0930    PT Stop Time 1010    PT Time Calculation (min) 40 min    Activity Tolerance Patient tolerated treatment well    Behavior During Therapy [facility] for tasks assessed/performed               Past Medical History:  Diagnosis Date   Diabetes mellitus type II, uncontrolled    HTN (hypertension)    Past Surgical History:  Procedure Laterality Date   CHOLECYSTECTOMY     COLONOSCOPY  10/27/2013   Wynona Neat. [name], MD. left sided diverticulosis. O/W normar screening colonoscopy.   Suburethral sling     For incontinence- 2000   Patient Active Problem List   Diagnosis Date Noted   Preventative health care 10/18/2021   Hyperlipidemia associated with type 2 diabetes mellitus ([place]) 04/10/2020   Hypertension 02/17/2020   Uncontrolled type 2 diabetes mellitus with hyperglycemia ([place]) 02/17/2020   Equinus deformity of foot, acquired 05/01/2014    PCP: Ann Held, DO  REFERRING PROVIDER: Jaquita Folds, MD  REFERRING DIAG: N32.81 (ICD-10-CM) - Overactive bladder  THERAPY DIAG:  Muscle weakness (generalized)  Unspecified lack of coordination  Abnormal posture  Rationale for Evaluation and Treatment Rehabilitation  ONSET DATE: 3 years at least  SUBJECTIVE:                                                                                                                                                                                           SUBJECTIVE STATEMENT: Pt states, "leakage is getting better!". Pt reports, she feels more control now and coming less frequent. Pt also reports she is drinking more water and still not having more leakage. Pt reports  she is now urinating about one time per hour.   Fluid intake: Yes: 4-5 16 oz bottles of water per day, 1 cup of coffee, sometimes 16 oz soda     PAIN:  Are you having pain? No   PRECAUTIONS: None  WEIGHT BEARING RESTRICTIONS No  FALLS:  Has patient fallen in last 6 months? No  LIVING ENVIRONMENT: Lives with: lives alone Lives in: House/apartment   OCCUPATION: minister   PLOF: Independent  PATIENT GOALS to have less leakage  PERTINENT HISTORY:  DM2, HTN, Suburethral sling 1999, CHOLECYSTECTOMY, left sided diverticulosis  Sexual abuse:  No  BOWEL MOVEMENT Pain with bowel movement: No Type of bowel movement:Type (Bristol Stool Scale) 4, Frequency daily to every other, and Strain No Fully empty rectum: Yes:   Leakage: No Pads: No Fiber supplement: No  URINATION Pain with urination: No Fully empty bladder: Yes:   Stream: Strong Urgency: Yes:   Frequency: 15 mins after drinking, but if not usually does not have urges to empty; 1-2x per night without leakage Leakage: Urge to void and Walking to the bathroom Pads: Yes: thick/large pads all the time changing 2x per day on average. At home wears smaller pads and changes 1-2x times.   INTERCOURSE Pain with intercourse:  has not been active for 19 years Not painful, but has not been active since divorce   PREGNANCY Vaginal deliveries 2 Tearing Yes: thinks she had a small tear with first C-section deliveries 0 Currently pregnant No  PROLAPSE None    OBJECTIVE:   DIAGNOSTIC FINDINGS:    COGNITION:  Overall cognitive status: Within functional limits for tasks assessed     SENSATION:  Light touch: Appears intact  Proprioception: Appears intact  MUSCLE LENGTH: Bil hamstrings and adductors limited by 50%                 POSTURE: rounded shoulders, forward head, and anterior pelvic tilt   LUMBARAROM/PROM  A/PROM A/PROM  eval  Flexion Limited by 50%  Extension WFL  Right lateral flexion Limited by  25%  Left lateral flexion Limited by 25%  Right rotation Limited by 50%  Left rotation Limited by 50%   (Blank rows = not tested)  LOWER EXTREMITY ROM:  WFL   LOWER EXTREMITY MMT:  Bil hips grossly 4/5 with exception of Lt hip abduction 3+/5, knees and ankles 5/5   PALPATION:   General  no TTP throughout abdomen, pelvis or hips or spine however did have fascial restrictions throughout all abdominal quadrants                External Perineal Exam mild dryness but no pain                              Internal Pelvic Floor no TTP  Patient confirms identification and approves PT to assess internal pelvic floor and treatment Yes  PELVIC MMT:    MMT eval  Vaginal 1/5, 1s isometric, 3 reps  Internal Anal Sphincter   External Anal Sphincter   Puborectalis   Diastasis Recti   (Blank rows = not tested)        TONE: Mildly decreased   PROLAPSE: Anterior wall laxity possible grade 2 noted in hooklying with strong cough in hooklying   TODAY'S TREATMENT  08/05/22  NMRE: all exercises cued for breathing mechanics and pelvic floor coordination for improved mechanics with pt strengthening and coordination to decrease leakage. 2X10 diaphragmatic breathing with pelvic floor contractions and TA activation with use of ball press as well 2X10 opp hand/knee ball press  Bridges with ball squeeze x10 2X10 Sit to stand from mat table 10#  SELF- CARE: pt educated on urge drill  and handout given    PATIENT EDUCATION:  Education details: [id number] Person educated: Patient Education method: Consulting civil engineer, Demonstration, Tactile cues, Verbal cues, and Handouts Education comprehension: verbalized understanding and returned demonstration   HOME EXERCISE PROGRAM: [id number]  ASSESSMENT:  CLINICAL IMPRESSION: Patient session focused on hip and core strengthening with coordination of pelvic floor and breathing mechanics to improve  pelvic floor strength and coordination for decreased bladder  deficits. Increased challenge today with more functional core strengthening with maintaining coordination. Pt tolerated well but did need minimal cues for coordination, this was greatly improved today and increased challenge of exercises today with added weight or increased components (breathing/core/pelvic floor). Pt would benefit from additional PT to further address deficits.     OBJECTIVE IMPAIRMENTS decreased coordination, decreased endurance, decreased strength, increased fascial restrictions, impaired flexibility, improper body mechanics, and postural dysfunction.   ACTIVITY LIMITATIONS continence  PARTICIPATION LIMITATIONS: community activity  PERSONAL FACTORS Fitness, Time since onset of injury/illness/exacerbation, and 1 comorbidity: medical history  are also affecting patient's functional outcome.   REHAB POTENTIAL: Good  CLINICAL DECISION MAKING: Stable/uncomplicated  EVALUATION COMPLEXITY: Low   GOALS: Goals reviewed with patient? Yes  SHORT TERM GOALS: Target date: 07/08/2022  Pt to be I with HEP.  Baseline: Goal status: MET  2.  Pt to demonstrate at least 2/5 pelvic floor strength consistently for improved pelvic stability and decreased strain at pelvic floor/ decrease leakage.  Baseline:  Goal status: on going  3.  Pt to report improved time between bladder voids to at least 1 hour for improved QOL with decreased urinary frequency.   Baseline:  Goal status: MET  4.  Pt will have 25% less urgency due to bladder retraining and strengthening  Baseline:  Goal status: MET    LONG TERM GOALS: Target date: 09/09/22  Pt to be I with advanced HEP.  Baseline:  Goal status: INITIAL  2.  Pt to demonstrate at least 5/5 bil hip strength for improved pelvic stability and functional squats without leakage.  Baseline:  Goal status: INITIAL  3.  Pt to demonstrate at least 4/5 pelvic floor strength for improved pelvic stability and decreased strain at pelvic floor/  decrease leakage.  Baseline:  Goal status: INITIAL  4.  Pt will have 50% less urgency due to bladder retraining and strengthening  Baseline:  Goal status: INITIAL  5.  Pt will have to use 1 pads per day in no longer than size 2 due to decreased symptoms.  Baseline:  Goal status: INITIAL  6.  Pt to demonstrate improved coordination of pelvic floor and breathing mechanics during squatting 15# without leakage to improve pelvic stability and increase pelvic floor strength.  Baseline:  Goal status: INITIAL  PLAN: PT FREQUENCY: 1x/week  PT DURATION:  8 sessions  PLANNED INTERVENTIONS: Therapeutic exercises, Therapeutic activity, Neuromuscular re-education, Patient/Family education, Self Care, Joint mobilization, Dry Needling, Spinal mobilization, Cryotherapy, Moist heat, scar mobilization, Taping, Vasopneumatic device, Biofeedback, and Manual therapy  PLAN FOR NEXT SESSION: coordination of pelvic floor and breathing mechanics with exercises, strengthening pelvic floor, core, hips, bladder retraining, urge drill    Stacy Gardner, PT, DPT 08/05/2309:12 AM

## 2022-08-05 NOTE — Patient Instructions (Signed)

## 2022-08-12 ENCOUNTER — Encounter: Payer: Self-pay | Admitting: Family Medicine

## 2022-08-12 ENCOUNTER — Other Ambulatory Visit: Payer: Medicare HMO

## 2022-08-12 ENCOUNTER — Ambulatory Visit (INDEPENDENT_AMBULATORY_CARE_PROVIDER_SITE_OTHER): Payer: Medicare HMO | Admitting: Family Medicine

## 2022-08-12 ENCOUNTER — Telehealth: Payer: Self-pay | Admitting: Family Medicine

## 2022-08-12 VITALS — BP 142/100 | HR 80 | Temp 98.6°F | Resp 18 | Ht 63.5 in | Wt 185.2 lb

## 2022-08-12 DIAGNOSIS — I10 Essential (primary) hypertension: Secondary | ICD-10-CM

## 2022-08-12 DIAGNOSIS — E785 Hyperlipidemia, unspecified: Secondary | ICD-10-CM | POA: Diagnosis not present

## 2022-08-12 DIAGNOSIS — E1165 Type 2 diabetes mellitus with hyperglycemia: Secondary | ICD-10-CM

## 2022-08-12 DIAGNOSIS — E1169 Type 2 diabetes mellitus with other specified complication: Secondary | ICD-10-CM

## 2022-08-12 LAB — COMPREHENSIVE METABOLIC PANEL
ALT: 11 U/L (ref 0–35)
AST: 12 U/L (ref 0–37)
Albumin: 4 g/dL (ref 3.5–5.2)
Alkaline Phosphatase: 60 U/L (ref 39–117)
BUN: 10 mg/dL (ref 6–23)
CO2: 30 mEq/L (ref 19–32)
Calcium: 9.5 mg/dL (ref 8.4–10.5)
Chloride: 101 mEq/L (ref 96–112)
Creatinine, Ser: 0.79 mg/dL (ref 0.40–1.20)
GFR: 78.33 mL/min (ref >60.00)
Glucose, Bld: 206 mg/dL — ABNORMAL HIGH (ref 70–99)
Potassium: 4 mEq/L (ref 3.5–5.1)
Sodium: 136 mEq/L (ref 135–145)
Total Bilirubin: 0.8 mg/dL (ref 0.2–1.2)
Total Protein: 6.9 g/dL (ref 6.0–8.3)

## 2022-08-12 LAB — LIPID PANEL
Cholesterol: 118 mg/dL (ref 0–200)
HDL: 36.7 mg/dL — ABNORMAL LOW (ref >39.00)
LDL Cholesterol: 54 mg/dL (ref 0–99)
NonHDL: 80.83
Total CHOL/HDL Ratio: 3
Triglycerides: 136 mg/dL (ref 0.0–149.0)
VLDL: 27.2 mg/dL (ref 0.0–40.0)

## 2022-08-12 MED ORDER — DEXCOM G7 SENSOR MISC: 1.0000 | 3 refills | Status: DC

## 2022-08-12 NOTE — Telephone Encounter (Signed)
[facility] called stating they need clarification on the directions of pt's Rx for her Blood Glucose meter. Please Advise.

## 2022-08-12 NOTE — Patient Instructions (Signed)

## 2022-08-12 NOTE — Assessment & Plan Note (Signed)
Poorly controlled will alter medications, encouraged DASH diet, minimize caffeine and obtain adequate sleep. Report concerning symptoms and follow up as directed and as needed 

## 2022-08-12 NOTE — Assessment & Plan Note (Signed)
Per endo °

## 2022-08-12 NOTE — Progress Notes (Signed)
Subjective:   By signing my name below, I, Jordan Joseph, attest that this documentation has been prepared under the direction and in the presence of Jordan Joseph, Ingvald Theisen R, DO. 08/12/2022      Patient ID: Jordan AmenNorma Joseph, female    DOB: 17-Apr-1956, 66 y.o.   MRN: [id number]  Chief Complaint  Patient presents with   Hyperlipidemia   Diabetes   Follow-up    Hyperlipidemia Pertinent negatives include no chest pain, myalgias or shortness of breath.  Diabetes Pertinent negatives for hypoglycemia include no dizziness, headaches or nervousness/anxiousness. Pertinent negatives for diabetes include no chest pain and no weakness.   Patient is in today for a follow up visit.   Her blood pressure is elevated during this visit. She continues taking 10 mg amlodipine daily PO, 25 mg carvedilol 2x daily PO, 0.1 mg clonidine daily PO, 100-25 mg losartan-hydrochlorothiazide daily PO and reports no new issues while taking it. Her blood pressure at home typically measures 120/85. BP Readings from Last 3 Encounters:  08/12/22 (!) 142/100  06/24/22 (!) 152/90  06/06/22 (!) 144/90   Pulse Readings from Last 3 Encounters:  08/12/22 80  06/24/22 86  06/06/22 80   Her blood sugars measurements are up and down at home. She continues taking insulin regularly. She reports eating an egg Mcmuffin and orange juice this morning. She also drank a diet drink prior to this visit.   Lab Results  Component Value Date   HGBA1C 11.1 (A) 06/06/2022    Past Medical History:  Diagnosis Date   Diabetes mellitus type II, uncontrolled    HTN (hypertension)     Past Surgical History:  Procedure Laterality Date   CHOLECYSTECTOMY     COLONOSCOPY  10/27/2013   Jordan RunningMary D. [name], MD. left sided diverticulosis. O/W normar screening colonoscopy.   Suburethral sling     For incontinence- 2000    Family History  Problem Relation Age of Onset   Alzheimer's disease Mother    Hypertension Father    Cirrhosis Father     Diabetes Mellitus II Father    Diabetes Brother    Hypertension Brother    Colon cancer Neg Hx    Rectal cancer Neg Hx    Pancreatic cancer Neg Hx    Stomach cancer Neg Hx    Esophageal cancer Neg Hx    Liver cancer Neg Hx     Social History   Socioeconomic History   Marital status: Divorced    Spouse name: Not on file   Number of children: 2   Years of education: Not on file   Highest education level: Not on file  Occupational History   Occupation: Engineer, sitemethodist pastor  Tobacco Use   Smoking status: Never   Smokeless tobacco: Never  Vaping Use   Vaping Use: Never used  Substance and Sexual Activity   Alcohol use: Never   Drug use: Never   Sexual activity: Not Currently  Other Topics Concern   Not on file  Social History Narrative   Exercise -- walking 5000 steps    15 min trampoline   15 min gazelle    Social Determinants of Health   Financial Resource Strain: Not on file  Food Insecurity: Not on file  Transportation Needs: Not on file  Physical Activity: Not on file  Stress: Not on file  Social Connections: Not on file  Intimate Partner Violence: Not on file    Outpatient Medications Prior to Visit  Medication Sig Dispense Refill  amLODipine (NORVASC) 10 MG tablet Take 1 tablet (10 mg total) by mouth daily. 90 tablet 1   carvedilol (COREG) 25 MG tablet Take 1 tablet (25 mg total) by mouth 2 (two) times daily with a meal. 180 tablet 3   cloNIDine (CATAPRES) 0.1 MG tablet Take 1 tablet (0.1 mg total) by mouth 2 (two) times daily. 60 tablet 3   dapagliflozin propanediol (FARXIGA) 10 MG TABS tablet Take 1 tablet (10 mg total) by mouth daily before breakfast. 90 tablet 3   glucose blood (FREESTYLE LITE) test strip 1 each by Other route daily in the afternoon. Use as instructed 90 each 3   insulin degludec (TRESIBA FLEXTOUCH) 100 UNIT/ML FlexTouch Pen Inject 30 Units into the skin daily. 30 mL 3   Insulin Pen Needle (BD PEN NEEDLE MICRO U/F) 32G X 6 MM MISC 1 Device  by Does not apply route daily in the afternoon. 100 each 3   losartan-hydrochlorothiazide (HYZAAR) 100-25 MG tablet Take 1 tablet by mouth daily. 90 tablet 1   simvastatin (ZOCOR) 20 MG tablet Take 1 tablet (20 mg total) by mouth at bedtime. 90 tablet 1   Trospium Chloride 60 MG CP24 Take 1 capsule (60 mg total) by mouth daily. 30 capsule 5   Continuous Blood Gluc Sensor (DEXCOM G7 SENSOR) MISC 1 Device by Does not apply route as directed. 9 each 3   No facility-administered medications prior to visit.    No Known Allergies  Review of Systems  Constitutional:  Negative for chills, fever and malaise/fatigue.  HENT:  Negative for congestion and hearing loss.   Eyes:  Negative for discharge.  Respiratory:  Negative for cough, sputum production and shortness of breath.   Cardiovascular:  Negative for chest pain, palpitations and leg swelling.  Gastrointestinal:  Negative for abdominal pain, blood in stool, constipation, diarrhea, heartburn, nausea and vomiting.  Genitourinary:  Negative for dysuria, frequency, hematuria and urgency.  Musculoskeletal:  Negative for back pain, falls and myalgias.  Skin:  Negative for rash.  Neurological:  Negative for dizziness, sensory change, loss of consciousness, weakness and headaches.  Endo/Heme/Allergies:  Negative for environmental allergies. Does not bruise/bleed easily.  Psychiatric/Behavioral:  Negative for depression and suicidal ideas. The patient is not nervous/anxious and does not have insomnia.        Objective:    Physical Exam Vitals and nursing note reviewed.  Constitutional:      General: She is not in acute distress.    Appearance: Normal appearance. She is not ill-appearing.  HENT:     Head: Normocephalic and atraumatic.     Right Ear: External ear normal.     Left Ear: External ear normal.  Eyes:     Extraocular Movements: Extraocular movements intact.     Pupils: Pupils are equal, round, and reactive to light.   Cardiovascular:     Rate and Rhythm: Normal rate and regular rhythm.     Heart sounds: Normal heart sounds. No murmur heard.    No gallop.  Pulmonary:     Effort: Pulmonary effort is normal. No respiratory distress.     Breath sounds: Normal breath sounds. No wheezing or rales.  Musculoskeletal:        General: Normal range of motion.  Skin:    General: Skin is warm and dry.  Neurological:     Mental Status: She is alert and oriented to person, place, and time.     Gait: Gait normal.  Psychiatric:  Mood and Affect: Mood normal.        Behavior: Behavior normal.        Thought Content: Thought content normal.        Judgment: Judgment normal.     BP (!) 142/100 (BP Location: Left Arm, Patient Position: Sitting, Cuff Size: Normal)   Pulse 80   Temp 98.6 F (37 C) (Oral)   Resp 18   Ht 5' 3.5" (1.613 m)   Wt 185 lb 3.2 oz (84 kg)   LMP  (LMP Unknown)   SpO2 97%   BMI 32.29 kg/m  Wt Readings from Last 3 Encounters:  08/12/22 185 lb 3.2 oz (84 kg)  06/06/22 184 lb (83.5 kg)  05/12/22 181 lb (82.1 kg)    Diabetic Foot Exam - Simple   No data filed    Lab Results  Component Value Date   WBC 6.1 05/05/2022   HGB 13.0 05/05/2022   HCT 38.4 05/05/2022   PLT 254.0 05/05/2022   GLUCOSE 206 (H) 08/12/2022   CHOL 118 08/12/2022   TRIG 136.0 08/12/2022   HDL 36.70 (L) 08/12/2022   LDLDIRECT 84.0 05/05/2022   LDLCALC 54 08/12/2022   ALT 11 08/12/2022   AST 12 08/12/2022   NA 136 08/12/2022   K 4.0 08/12/2022   CL 101 08/12/2022   CREATININE 0.79 08/12/2022   BUN 10 08/12/2022   CO2 30 08/12/2022   TSH 1.76 10/18/2021   HGBA1C 11.1 (A) 06/06/2022   MICROALBUR 28.7 (H) 06/21/2021    Lab Results  Component Value Date   TSH 1.76 10/18/2021   Lab Results  Component Value Date   WBC 6.1 05/05/2022   HGB 13.0 05/05/2022   HCT 38.4 05/05/2022   MCV 95.9 05/05/2022   PLT 254.0 05/05/2022   Lab Results  Component Value Date   NA 136 08/12/2022   K 4.0  08/12/2022   CO2 30 08/12/2022   GLUCOSE 206 (H) 08/12/2022   BUN 10 08/12/2022   CREATININE 0.79 08/12/2022   BILITOT 0.8 08/12/2022   ALKPHOS 60 08/12/2022   AST 12 08/12/2022   ALT 11 08/12/2022   PROT 6.9 08/12/2022   ALBUMIN 4.0 08/12/2022   CALCIUM 9.5 08/12/2022   GFR 78.33 08/12/2022   Lab Results  Component Value Date   CHOL 118 08/12/2022   Lab Results  Component Value Date   HDL 36.70 (L) 08/12/2022   Lab Results  Component Value Date   LDLCALC 54 08/12/2022   Lab Results  Component Value Date   TRIG 136.0 08/12/2022   Lab Results  Component Value Date   CHOLHDL 3 08/12/2022   Lab Results  Component Value Date   HGBA1C 11.1 (A) 06/06/2022       Assessment & Plan:   Problem List Items Addressed This Visit       Unprioritized   Hypertension - Primary   Relevant Orders   CBC with Differential/Platelet   Comprehensive metabolic panel   Lipid panel   Microalbumin / creatinine urine ratio   AMB Referral to Chronic Care Management Services   Hyperlipidemia associated with type 2 diabetes mellitus (HCC)   Relevant Orders   CBC with Differential/Platelet   Comprehensive metabolic panel   Lipid panel   Microalbumin / creatinine urine ratio   AMB Referral to Chronic Care Management Services   Other Visit Diagnoses     Type 2 diabetes mellitus with other specified complication, without long-term current use of insulin (HCC)  Relevant Medications   Continuous Blood Gluc Sensor (DEXCOM G7 SENSOR) MISC   Other Relevant Orders   CBC with Differential/Platelet   Comprehensive metabolic panel   Lipid panel   Microalbumin / creatinine urine ratio   AMB Referral to Chronic Care Management Services        Meds ordered this encounter  Medications   Continuous Blood Gluc Sensor (DEXCOM G7 SENSOR) MISC    Sig: 1 Device by Does not apply route as directed.    Dispense:  9 each    Refill:  3    I, Jordan Schultz, DO, personally  preformed the services described in this documentation.  All medical record entries made by the scribe were at my direction and in my presence.  I have reviewed the chart and discharge instructions (if applicable) and agree that the record reflects my personal performance and is accurate and complete. 08/12/2022   I,Jordan Joseph,acting as a scribe for Jordan Schultz, DO.,have documented all relevant documentation on the behalf of Jordan Schultz, DO,as directed by  Jordan Schultz, DO while in the presence of Jordan Schultz, DO.   Jordan Schultz, DO

## 2022-08-12 NOTE — Assessment & Plan Note (Signed)
Encourage heart healthy diet such as MIND or DASH diet, increase exercise, avoid trans fats, simple carbohydrates and processed foods, consider a krill or fish or flaxseed oil cap daily.  °

## 2022-08-13 ENCOUNTER — Telehealth: Payer: Self-pay | Admitting: Nutrition

## 2022-08-13 NOTE — Telephone Encounter (Signed)
Pt is using Dexcom meter

## 2022-08-13 NOTE — Telephone Encounter (Signed)
LVM to call me to discuss insulin options

## 2022-08-14 ENCOUNTER — Telehealth: Payer: Self-pay | Admitting: Dietician

## 2022-08-14 DIAGNOSIS — E1169 Type 2 diabetes mellitus with other specified complication: Secondary | ICD-10-CM

## 2022-08-14 NOTE — Telephone Encounter (Signed)
Called patient who was unavailable.  She had told our office that she is having a problem getting her Dexcom G7.  Noted that this has been reordered by her PCP on 08/12/2022.  Left message for her to call us if this did not solve the order problem.  Oran Rein, RD, LDN, CDCES

## 2022-08-14 NOTE — Telephone Encounter (Signed)
Returned patient call.  She states that she has called [facility] and they do not have the prescription details for her Dexcom G7.  Messaged medical assistant to refax with needed information. Patient to call us when she needs training. Her phone is compatible.  Oran Rein, RD, LDN, CDCES

## 2022-08-15 MED ORDER — DEXCOM G7 SENSOR MISC: 6 refills | Status: AC

## 2022-08-15 NOTE — Telephone Encounter (Signed)
Script sent with directions

## 2022-08-20 ENCOUNTER — Telehealth: Payer: Self-pay

## 2022-08-20 NOTE — Progress Notes (Signed)
  Chronic Care Management   Note  08/20/2022 Name: Jordan Joseph MRN: [id number] DOB: 02-May-1956  Jordan Joseph is a 66 y.o. year old female who is a primary care patient of Donato Schultz, DO. I reached out to Dolan Amen by phone today in response to a referral sent by Jordan Joseph's PCP.  [name] was not successfully contacted today. A HIPAA compliant voice message was left requesting a return call.   Follow up plan: Additional outreach attempts will be made.  Penne Lash, RMA Care Guide [facility]  [place], [place] [address] Direct Dial: [phone] [email or web address]

## 2022-08-26 ENCOUNTER — Ambulatory Visit: Payer: Medicare HMO | Admitting: Physical Therapy

## 2022-08-26 DIAGNOSIS — R279 Unspecified lack of coordination: Secondary | ICD-10-CM

## 2022-08-26 DIAGNOSIS — M6281 Muscle weakness (generalized): Secondary | ICD-10-CM | POA: Diagnosis not present

## 2022-08-26 DIAGNOSIS — E1165 Type 2 diabetes mellitus with hyperglycemia: Secondary | ICD-10-CM | POA: Diagnosis not present

## 2022-08-26 DIAGNOSIS — R293 Abnormal posture: Secondary | ICD-10-CM | POA: Diagnosis not present

## 2022-08-26 NOTE — Therapy (Addendum)
OUTPATIENT PHYSICAL THERAPY FEMALE PELVIC EVALUATION   Patient Name: Jordan Joseph MRN: [id number] DOB:[birth year], 66 y.o., female Today's Date: 08/26/2022   PT End of Session - 08/26/22 0931     Visit Number 5    Date for PT Re-Evaluation 09/09/22    Authorization Type [employer]    PT Start Time 0930    PT Stop Time 1012    PT Time Calculation (min) 42 min    Activity Tolerance Patient tolerated treatment well    Behavior During Therapy [facility] for tasks assessed/performed               Past Medical History:  Diagnosis Date   Diabetes mellitus type II, uncontrolled    HTN (hypertension)    Past Surgical History:  Procedure Laterality Date   CHOLECYSTECTOMY     COLONOSCOPY  10/27/2013   Wynona Neat. [name], MD. left sided diverticulosis. O/W normar screening colonoscopy.   Suburethral sling     For incontinence- 2000   Patient Active Problem List   Diagnosis Date Noted   Preventative health care 10/18/2021   Hyperlipidemia associated with type 2 diabetes mellitus ([place]) 04/10/2020   Hypertension 02/17/2020   Uncontrolled type 2 diabetes mellitus with hyperglycemia ([place]) 02/17/2020   Equinus deformity of foot, acquired 05/01/2014    PCP: Ann Held, DO  REFERRING PROVIDER: Jaquita Folds, MD  REFERRING DIAG: N32.81 (ICD-10-CM) - Overactive bladder  THERAPY DIAG:  Muscle weakness (generalized)  Unspecified lack of coordination  Rationale for Evaluation and Treatment Rehabilitation  ONSET DATE: 3 years at least  SUBJECTIVE:                                                                                                                                                                                           SUBJECTIVE STATEMENT: "It's hard to say when leakage has even bothered me. Even with Thanksgiving and the fluids I was drinking I didn't have a lot. At night frequency is bothering me but I have control now." Pt states she can now feel  pelvic floor contractions and make it bathroom now without leakage. Pt states she has been drinking and eating much later than usual and this past week has been getting up about 4x at night to urinate but no leakage and does not wear any liners at night now. Pt also reports she is pleased with driving longer distances now, no longer scared of drinking water in the car, no anxiety about leakage in the car. Now able to wait 2.5-3 hours between day time voids without leakage now.   Fluid intake: Yes: 4-5 16 oz bottles  of water per day, 1 cup of coffee, sometimes 16 oz soda     PAIN:  Are you having pain? No   PRECAUTIONS: None  WEIGHT BEARING RESTRICTIONS No  FALLS:  Has patient fallen in last 6 months? No  LIVING ENVIRONMENT: Lives with: lives alone Lives in: House/apartment   OCCUPATION: minister   PLOF: Independent  PATIENT GOALS to have less leakage  PERTINENT HISTORY:  DM2, HTN, Suburethral sling 1999, CHOLECYSTECTOMY, left sided diverticulosis  Sexual abuse: No  BOWEL MOVEMENT Pain with bowel movement: No Type of bowel movement:Type ([place] Stool Scale) 4, Frequency daily to every other, and Strain No Fully empty rectum: Yes:   Leakage: No Pads: No Fiber supplement: No  URINATION Pain with urination: No Fully empty bladder: Yes:   Stream: Strong Urgency: Yes:   Frequency: 15 mins after drinking, but if not usually does not have urges to empty; 1-2x per night without leakage Leakage: Urge to void and Walking to the bathroom Pads: Yes: thick/large pads all the time changing 2x per day on average. At home wears smaller pads and changes 1-2x times.   INTERCOURSE Pain with intercourse:  has not been active for 19 years Not painful, but has not been active since divorce   PREGNANCY Vaginal deliveries 2 Tearing Yes: thinks she had a small tear with first C-section deliveries 0 Currently pregnant No  PROLAPSE None    OBJECTIVE:   DIAGNOSTIC FINDINGS:     COGNITION:  Overall cognitive status: Within functional limits for tasks assessed     SENSATION:  Light touch: Appears intact  Proprioception: Appears intact  MUSCLE LENGTH: Bil hamstrings and adductors limited by 50%                 POSTURE: rounded shoulders, forward head, and anterior pelvic tilt   LUMBARAROM/PROM  A/PROM A/PROM  eval  Flexion Limited by 50%  Extension WFL  Right lateral flexion Limited by 25%  Left lateral flexion Limited by 25%  Right rotation Limited by 50%  Left rotation Limited by 50%   (Blank rows = not tested)  LOWER EXTREMITY ROM:  WFL   LOWER EXTREMITY MMT:  Bil hips grossly 4/5 with exception of Lt hip abduction 3+/5, knees and ankles 5/5   PALPATION:   General  no TTP throughout abdomen, pelvis or hips or spine however did have fascial restrictions throughout all abdominal quadrants                External Perineal Exam mild dryness but no pain                              Internal Pelvic Floor no TTP  Patient confirms identification and approves PT to assess internal pelvic floor and treatment Yes  PELVIC MMT:    MMT eval 08/26/22   Vaginal 1/5, 1s isometric, 3 reps 4/5, 7s, 4 reps  Internal Anal Sphincter    External Anal Sphincter    Puborectalis    Diastasis Recti    (Blank rows = not tested)        TONE: Mildly decreased   PROLAPSE: Anterior wall laxity possible grade 2 noted in hooklying with strong cough in hooklying  08/26/22: with cough grade 1 anterior wall laxity and even less movement with cough and active pelvic floor contraction   TODAY'S TREATMENT   08/26/22: Pt educated on urge drill at night to improve nighttime urination frequency.  Pt  consented to internal vaginal treatment/reassessment this date and found to have improved strength, coordination, and endurance.  Findings above in chart.  0X65 quick flicks, V37 4-8O isometrics, 2x10 pelvic floor contractions. Pt does demonstrate decreased  strength with increased reps however best strength/endurance/coordination listed above.  08/05/22  NMRE: all exercises cued for breathing mechanics and pelvic floor coordination for improved mechanics with pt strengthening and coordination to decrease leakage. 2X10 diaphragmatic breathing with pelvic floor contractions and TA activation with use of ball press as well 2X10 opp hand/knee ball press  Bridges with ball squeeze x10 2X10 Sit to stand from mat table 10#  SELF- CARE: pt educated on urge drill  and handout given    PATIENT EDUCATION:  Education details: [id number] Person educated: Patient Education method: Consulting civil engineer, Demonstration, Tactile cues, Verbal cues, and Handouts Education comprehension: verbalized understanding and returned demonstration   HOME EXERCISE PROGRAM: [id number]  ASSESSMENT:  CLINICAL IMPRESSION: Patient session focused updating HEP, education for nighttime urination frequency and progressing pelvic floor strengthening from sitting to standing based on pt's continued progress. Also, pt consented to internal vaginal treatment/reassessment today and found to have improved strength, endurance, and coordination with all reps. Pt does need very minimal verbal cues for breathing coordination and this does improve her strength also pt demonstrated improvement with anterior wall laxity. Pt has met all STG and progressing to met all LTG. Pt would benefit from additional PT to further address deficits.     OBJECTIVE IMPAIRMENTS decreased coordination, decreased endurance, decreased strength, increased fascial restrictions, impaired flexibility, improper body mechanics, and postural dysfunction.   ACTIVITY LIMITATIONS continence  PARTICIPATION LIMITATIONS: community activity  PERSONAL FACTORS Fitness, Time since onset of injury/illness/exacerbation, and 1 comorbidity: medical history  are also affecting patient's functional outcome.   REHAB POTENTIAL:  Good  CLINICAL DECISION MAKING: Stable/uncomplicated  EVALUATION COMPLEXITY: Low   GOALS: Goals reviewed with patient? Yes  SHORT TERM GOALS: Target date: 07/08/2022  Pt to be I with HEP.  Baseline: Goal status: MET  2.  Pt to demonstrate at least 2/5 pelvic floor strength consistently for improved pelvic stability and decreased strain at pelvic floor/ decrease leakage.  Baseline:  Goal status: MET  3.  Pt to report improved time between bladder voids to at least 1 hour for improved QOL with decreased urinary frequency.   Baseline:  Goal status: MET  4.  Pt will have 25% less urgency due to bladder retraining and strengthening  Baseline:  Goal status: MET    LONG TERM GOALS: Target date: 09/09/22  Pt to be I with advanced HEP.  Baseline:  Goal status: MET  2.  Pt to demonstrate at least 5/5 bil hip strength for improved pelvic stability and functional squats without leakage.  Baseline:  Goal status: On going  3.  Pt to demonstrate at least 4/5 pelvic floor strength for improved pelvic stability and decreased strain at pelvic floor/ decrease leakage.  Baseline:  Goal status: MET  4.  Pt will have 50% less urgency due to bladder retraining and strengthening  Baseline:  Goal status: MET  5.  Pt will have to use 1 pads per day in no longer than size 2 due to decreased symptoms.  Baseline:  Goal status: MET  6.  Pt to demonstrate improved coordination of pelvic floor and breathing mechanics during squatting 15# without leakage to improve pelvic stability and increase pelvic floor strength.  Baseline:  Goal status: on going  PLAN: PT FREQUENCY: 1x/week  PT DURATION:  8 sessions  PLANNED INTERVENTIONS: Therapeutic exercises, Therapeutic activity, Neuromuscular re-education, Patient/Family education, Self Care, Joint mobilization, Dry Needling, Spinal mobilization, Cryotherapy, Moist heat, scar mobilization, Taping, Vasopneumatic device, Biofeedback, and Manual  therapy  PLAN FOR NEXT SESSION: coordination of pelvic floor and breathing mechanics with exercises, strengthening pelvic floor, core, hips, bladder retraining, urge drill    Otelia Sergeant, PT, DPT 08/26/2309:21 AM   PHYSICAL THERAPY DISCHARGE SUMMARY  Visits from Start of Care: 5  Current functional level related to goals / functional outcomes: Unable to formally reassess as pt has been unable to return since last visit   Remaining deficits: Unable to formally reassess as pt has been unable to return since last visit   Education / Equipment: HEP   Patient agrees to discharge. Patient goals were partially met. Patient is being discharged due to not returning since the last visit.  Otelia Sergeant, PT, DPT 04/15/244:30 PM

## 2022-09-01 NOTE — Progress Notes (Signed)
  Chronic Care Management   Note  09/01/2022 Name: Jordan Joseph MRN: [id number] DOB: 1955-12-06  Jordan Joseph is a 66 y.o. year old female who is a primary care patient of Donato Schultz, DO. I reached out to Dolan Amen by phone today in response to a referral sent by [name]'s PCP.  [name]  declinedto scheduling an appointment with the CCM Pharmacist  and RNCM   Follow up plan: Patient did not agree to scheduling an appointment with the Pharmacist and RNCM. The ordering provider has been notified.   Penne Lash, RMA Care Guide [facility]  [place], [place] [address] Direct Dial: [phone] [email or web address]

## 2022-09-02 ENCOUNTER — Ambulatory Visit: Payer: Medicare HMO | Admitting: Physical Therapy

## 2022-09-02 ENCOUNTER — Ambulatory Visit: Payer: Medicare HMO

## 2022-09-03 ENCOUNTER — Encounter: Payer: Medicare HMO | Admitting: Nutrition

## 2022-09-09 ENCOUNTER — Ambulatory Visit: Payer: Medicare HMO | Admitting: Physical Therapy

## 2022-09-16 ENCOUNTER — Ambulatory Visit (INDEPENDENT_AMBULATORY_CARE_PROVIDER_SITE_OTHER): Payer: Medicare HMO | Admitting: Family Medicine

## 2022-09-16 VITALS — BP 107/70 | HR 79

## 2022-09-16 DIAGNOSIS — I10 Essential (primary) hypertension: Secondary | ICD-10-CM

## 2022-09-16 NOTE — Progress Notes (Signed)
Pt here for Blood pressure check per Dr. Zola Button.  Pt currently takes: amlodipine 10mg , carvedilol 25mg , clonidine 0.1mg , losartan-hctz 100-25mg    Pt reports compliance with medication.  BP Readings from Last 3 Encounters:  08/12/22 (!) 142/100  06/24/22 (!) 152/90  06/06/22 (!) 144/90    BP today  1035am right arm 148/78 HR 79 1040am left arm 112/74 HR 80  Recheck 1042am right arm 126/79 HR 79 1044am left arm 107/70 HR 79   Pt advised per Dr. 06/26/22 blood pressure looks great.  Continue to take all medications as prescribe and we will see you at follow up in May.

## 2022-09-26 DIAGNOSIS — E1165 Type 2 diabetes mellitus with hyperglycemia: Secondary | ICD-10-CM | POA: Diagnosis not present

## 2022-10-15 ENCOUNTER — Encounter: Payer: Medicare HMO | Attending: Internal Medicine | Admitting: Nutrition

## 2022-10-15 DIAGNOSIS — E1165 Type 2 diabetes mellitus with hyperglycemia: Secondary | ICD-10-CM | POA: Insufficient documentation

## 2022-10-15 NOTE — Patient Instructions (Signed)
Change sensor every 10 days Call Dexcom help line if sensor falls off, or if questions.

## 2022-10-15 NOTE — Progress Notes (Signed)
Patient was trained on the use of the [place].  We discussed the difference between sensor readings and blood sugar readings and when she is need to do a blood sugar reading.  She reported good understanding of this. Her readings are going to her phone.  The G7 and clarity apps were downloaded to her phone and linked to [facility].  A sensor was started in her left outer arm and she put the linking code into her phone and started the sensor.  She had no final questions.

## 2022-10-27 DIAGNOSIS — E1165 Type 2 diabetes mellitus with hyperglycemia: Secondary | ICD-10-CM | POA: Diagnosis not present

## 2022-11-03 ENCOUNTER — Ambulatory Visit (INDEPENDENT_AMBULATORY_CARE_PROVIDER_SITE_OTHER): Payer: Medicare HMO | Admitting: *Deleted

## 2022-11-03 DIAGNOSIS — Z0000 Encounter for general adult medical examination without abnormal findings: Secondary | ICD-10-CM | POA: Diagnosis not present

## 2022-11-03 NOTE — Progress Notes (Signed)
Subjective:   Jordan Joseph is a 67 y.o. female who presents for an Initial Medicare Annual Wellness Visit.  I connected with  Dolan Amen on 11/03/22 by a audio enabled telemedicine application and verified that I am speaking with the correct person using two identifiers.  Patient Location: Home  Provider Location: Office/Clinic  I discussed the limitations of evaluation and management by telemedicine. The patient expressed understanding and agreed to proceed.   Review of Systems    Defer to PCP Cardiac Risk Factors include: advanced age (>32men, >25 women);dyslipidemia;hypertension;diabetes mellitus     Objective:    There were no vitals filed for this visit. There is no height or weight on file to calculate BMI.     11/03/2022    1:02 PM 06/10/2022    9:36 AM 03/21/2020   11:13 AM  Advanced Directives  Does Patient Have a Medical Advance Directive? No No No  Would patient like information on creating a medical advance directive? No - Patient declined No - Patient declined No - Patient declined    Current Medications (verified) Outpatient Encounter Medications as of 11/03/2022  Medication Sig   amLODipine (NORVASC) 10 MG tablet Take 1 tablet (10 mg total) by mouth daily.   carvedilol (COREG) 25 MG tablet Take 1 tablet (25 mg total) by mouth 2 (two) times daily with a meal.   cloNIDine (CATAPRES) 0.1 MG tablet Take 1 tablet (0.1 mg total) by mouth 2 (two) times daily.   Continuous Blood Gluc Sensor (DEXCOM G7 SENSOR) MISC Change sensor every 10 days   dapagliflozin propanediol (FARXIGA) 10 MG TABS tablet Take 1 tablet (10 mg total) by mouth daily before breakfast.   glucose blood (FREESTYLE LITE) test strip 1 each by Other route daily in the afternoon. Use as instructed   insulin degludec (TRESIBA FLEXTOUCH) 100 UNIT/ML FlexTouch Pen Inject 30 Units into the skin daily.   Insulin Pen Needle (BD PEN NEEDLE MICRO U/F) 32G X 6 MM MISC 1 Device by Does not apply route daily in the  afternoon.   losartan-hydrochlorothiazide (HYZAAR) 100-25 MG tablet Take 1 tablet by mouth daily.   simvastatin (ZOCOR) 20 MG tablet Take 1 tablet (20 mg total) by mouth at bedtime.   Trospium Chloride 60 MG CP24 Take 1 capsule (60 mg total) by mouth daily.   No facility-administered encounter medications on file as of 11/03/2022.    Allergies (verified) Patient has no known allergies.   History: Past Medical History:  Diagnosis Date   Diabetes mellitus type II, uncontrolled    HTN (hypertension)    Past Surgical History:  Procedure Laterality Date   CHOLECYSTECTOMY     COLONOSCOPY  10/27/2013   [name], MD. left sided diverticulosis. O/W normar screening colonoscopy.   Suburethral sling     For incontinence- 2000   Family History  Problem Relation Age of Onset   Alzheimer's disease Mother    Hypertension Father    Cirrhosis Father    Diabetes Mellitus II Father    Diabetes Brother    Hypertension Brother    Colon cancer Neg Hx    Rectal cancer Neg Hx    Pancreatic cancer Neg Hx    Stomach cancer Neg Hx    Esophageal cancer Neg Hx    Liver cancer Neg Hx    Social History   Socioeconomic History   Marital status: Divorced    Spouse name: Not on file   Number of children: 2   Years of  education: Not on file   Highest education level: Not on file  Occupational History   Occupation: Curator  Tobacco Use   Smoking status: Never   Smokeless tobacco: Never  Vaping Use   Vaping Use: Never used  Substance and Sexual Activity   Alcohol use: Never   Drug use: Never   Sexual activity: Not Currently  Other Topics Concern   Not on file  Social History Narrative   Exercise -- walking 5000 steps    15 min trampoline   15 min gazelle    Social Determinants of Health   Financial Resource Strain: Low Risk  (11/03/2022)   Overall Financial Resource Strain (CARDIA)    Difficulty of Paying Living Expenses: Not hard at all  Food Insecurity: No Food  Insecurity (11/03/2022)   Hunger Vital Sign    Worried About Running Out of Food in the Last Year: Never true    Ran Out of Food in the Last Year: Never true  Transportation Needs: No Transportation Needs (11/03/2022)   PRAPARE - Hydrologist (Medical): No    Lack of Transportation (Non-Medical): No  Physical Activity: Insufficiently Active (11/03/2022)   Exercise Vital Sign    Days of Exercise per Week: 3 days    Minutes of Exercise per Session: 30 min  Stress: No Stress Concern Present (11/03/2022)   [place]    Feeling of Stress : Not at all  Social Connections: Moderately Integrated (11/03/2022)   Social Connection and Isolation Panel [NHANES]    Frequency of Communication with Friends and Family: More than three times a week    Frequency of Social Gatherings with Friends and Family: More than three times a week    Attends Religious Services: More than 4 times per year    Active Member of [employer] or Organizations: Yes    Attends Music therapist: More than 4 times per year    Marital Status: Divorced    Tobacco Counseling Counseling given: Not Answered   Clinical Intake:  Pre-visit preparation completed: Yes  Pain : No/denies pain  How often do you need to have someone help you when you read instructions, pamphlets, or other written materials from your doctor or pharmacy?: 1 - Never  Diabetic? Nutrition Risk Assessment:  Has the patient had any N/V/D within the last 2 months?  No  Does the patient have any non-healing wounds?  No  Has the patient had any unintentional weight loss or weight gain?  No   Diabetes:  Is the patient diabetic?  Yes  If diabetic, was a CBG obtained today?  No  Did the patient bring in their glucometer from home?  No  How often do you monitor your CBG's? Wears CGM.   Financial Strains and Diabetes Management:  Are you having any financial  strains with the device, your supplies or your medication? No .  Does the patient want to be seen by Chronic Care Management for management of their diabetes?  No  Would the patient like to be referred to a Nutritionist or for Diabetic Management?  No   Diabetic Exams:  Diabetic Eye Exam: Overdue for diabetic eye exam. Pt has been advised about the importance in completing this exam. Patient advised to call and schedule an eye exam. Diabetic Foot Exam: Completed 06/06/22   Interpreter Needed?: No  Information entered by :: Beatris Ship, CMA   Activities of Daily Living  11/03/2022    1:06 PM  In your present state of health, do you have any difficulty performing the following activities:  Hearing? 0  Vision? 1  Difficulty concentrating or making decisions? 0  Walking or climbing stairs? 0  Dressing or bathing? 0  Doing errands, shopping? 0  Preparing Food and eating ? N  Using the Toilet? N  In the past six months, have you accidently leaked urine? Y  Comment wears pads  Do you have problems with loss of bowel control? N  Managing your Medications? N  Managing your Finances? N  Housekeeping or managing your Housekeeping? N    Patient Care Team: Carollee Herter, Alferd Apa, DO as PCP - General (Family Medicine)  Indicate any recent Medical Services you may have received from other than [facility] providers in the past year (date may be approximate).     Assessment:   This is a routine wellness examination for May.  Hearing/Vision screen No results found.  Dietary issues and exercise activities discussed: Current Exercise Habits: Home exercise routine, Type of exercise: walking;Other - see comments (elliptical), Time (Minutes): 30, Frequency (Times/Week): 3, Weekly Exercise (Minutes/Week): 90, Intensity: Moderate, Exercise limited by: None identified   Goals Addressed   None    Depression Screen    11/03/2022    1:06 PM 05/05/2022    2:32 PM 03/12/2021    2:53 PM 03/21/2020    11:14 AM  PHQ 2/9 Scores  PHQ - 2 Score 0 0 0 0    Fall Risk    11/03/2022    1:02 PM 05/05/2022    2:32 PM 06/21/2021   10:27 AM 03/12/2021    2:53 PM 03/21/2020   11:14 AM  Fall Risk   Falls in the past year? 0 0 0 0 0  Number falls in past yr: 0 0 0 0 0  Injury with Fall? 0 0 0 0   Risk for fall due to : No Fall Risks      Follow up Falls evaluation completed Falls evaluation completed Falls evaluation completed Falls evaluation completed     [place]:  Any stairs in or around the home? Yes  If so, are there any without handrails? No  Home free of loose throw rugs in walkways, pet beds, electrical cords, etc? Yes  Adequate lighting in your home to reduce risk of falls? Yes   ASSISTIVE DEVICES UTILIZED TO PREVENT FALLS:  Life alert? No  Use of a cane, walker or w/c? No  Grab bars in the bathroom? Yes  Shower chair or bench in shower? No  Elevated toilet seat or a handicapped toilet? No   TIMED UP AND GO:  Was the test performed?  No, audio visit .   Cognitive Function:        11/03/2022    1:12 PM  6CIT Screen  What Year? 0 points  What month? 0 points  What time? 0 points  Count back from 20 0 points  Months in reverse 0 points  Repeat phrase 0 points  Total Score 0 points    Immunizations Immunization History  Administered Date(s) Administered   MODERNA COVID-19 SARS-COV-2 PEDS BIVALENT BOOSTER 6Y-11Y 09/11/2021   Moderna Sars-Covid-2 Vaccination 12/17/2019, 01/14/2020, 08/20/2020, 01/29/2021   Tdap 10/18/2021    TDAP status: Up to date  Flu Vaccine status: Declined, Education has been provided regarding the importance of this vaccine but patient still declined. Advised may receive this vaccine at local  pharmacy or Health Dept. Aware to provide a copy of the vaccination record if obtained from local pharmacy or Health Dept. Verbalized acceptance and understanding.  Pneumococcal vaccine status: Due, Education has been  provided regarding the importance of this vaccine. Advised may receive this vaccine at local pharmacy or Health Dept. Aware to provide a copy of the vaccination record if obtained from local pharmacy or Health Dept. Verbalized acceptance and understanding.  Covid-19 vaccine status: Information provided on how to obtain vaccines.   Qualifies for Shingles Vaccine? Yes   Zostavax completed No   Shingrix Completed?: No.    Education has been provided regarding the importance of this vaccine. Patient has been advised to call insurance company to determine out of pocket expense if they have not yet received this vaccine. Advised may also receive vaccine at local pharmacy or Health Dept. Verbalized acceptance and understanding.  Screening Tests Health Maintenance  Topic Date Due   Medicare Annual Wellness (AWV)  Never done   OPHTHALMOLOGY EXAM  Never done   HIV Screening  Never done   Zoster Vaccines- Shingrix (1 of 2) Never done   Pneumonia Vaccine 48+ Years old (1 - PCV) Never done   COVID-19 Vaccine (6 - 2023-24 season) 05/30/2022   Diabetic kidney evaluation - Urine ACR  06/21/2022   HEMOGLOBIN A1C  12/05/2022   FOOT EXAM  06/07/2023   Diabetic kidney evaluation - eGFR measurement  08/13/2023   COLONOSCOPY (Pts 45-55yrs Insurance coverage will need to be confirmed)  10/28/2023   MAMMOGRAM  12/18/2023   PAP SMEAR-Modifier  02/20/2025   DTaP/Tdap/Td (2 - Td or Tdap) 10/19/2031   DEXA SCAN  Completed   Hepatitis C Screening  Completed   HPV VACCINES  Aged Out   INFLUENZA VACCINE  Discontinued    Health Maintenance  Health Maintenance Due  Topic Date Due   Medicare Annual Wellness (AWV)  Never done   OPHTHALMOLOGY EXAM  Never done   HIV Screening  Never done   Zoster Vaccines- Shingrix (1 of 2) Never done   Pneumonia Vaccine 61+ Years old (1 - PCV) Never done   COVID-19 Vaccine (6 - 2023-24 season) 05/30/2022   Diabetic kidney evaluation - Urine ACR  06/21/2022    Colorectal  cancer screening: Type of screening: Colonoscopy. Completed 10/27/13. Repeat every 10 years  Mammogram status: Completed 12/17/21. Repeat every year  Bone Density status: Completed 12/17/21. Results reflect: Bone density results: NORMAL. Repeat every 2 years.  Lung Cancer Screening: (Low Dose CT Chest recommended if Age 53-80 years, 30 pack-year currently smoking OR have quit w/in 15years.) does not qualify.   Additional Screening:  Hepatitis C Screening: does qualify; Completed 10/18/21  Vision Screening: Recommended annual ophthalmology exams for early detection of glaucoma and other disorders of the eye. Is the patient up to date with their annual eye exam?  Yes  Who is the provider or what is the name of the office in which the patient attends annual eye exams? Doesn't remember new doctor's name If pt is not established with a provider, would they like to be referred to a provider to establish care? No .   Dental Screening: Recommended annual dental exams for proper oral hygiene  Community Resource Referral / Chronic Care Management: CRR required this visit?  No   CCM required this visit?  No      Plan:     I have personally reviewed and noted the following in the patient's chart:   Medical and  social history Use of alcohol, tobacco or illicit drugs  Current medications and supplements including opioid prescriptions. Patient is not currently taking opioid prescriptions. Functional ability and status Nutritional status Physical activity Advanced directives List of other physicians Hospitalizations, surgeries, and ER visits in previous 12 months Vitals Screenings to include cognitive, depression, and falls Referrals and appointments  In addition, I have reviewed and discussed with patient certain preventive protocols, quality metrics, and best practice recommendations. A written personalized care plan for preventive services as well as general preventive health  recommendations were provided to patient.   Due to this being a telephonic visit, the after visit summary with patients personalized plan was offered to patient via mail or my-chart.  Patient would like to access on my-chart.  Beatris Ship, [place]   11/03/2022   Nurse Notes: None

## 2022-11-03 NOTE — Patient Instructions (Signed)
[name] , Thank you for taking time to come for your Medicare Wellness Visit. I appreciate your ongoing commitment to your health goals. Please review the following plan we discussed and let me know if I can assist you in the future.   These are the goals we discussed:  Goals   None     This is a list of the screening recommended for you and due dates:  Health Maintenance  Topic Date Due   Eye exam for diabetics  Never done   HIV Screening  Never done   Zoster (Shingles) Vaccine (1 of 2) Never done   Pneumonia Vaccine (1 - PCV) Never done   COVID-19 Vaccine (6 - 2023-24 season) 05/30/2022   Yearly kidney health urinalysis for diabetes  06/21/2022   Hemoglobin A1C  12/05/2022   Complete foot exam   06/07/2023   Yearly kidney function blood test for diabetes  08/13/2023   Colon Cancer Screening  10/28/2023   Medicare Annual Wellness Visit  11/04/2023   Mammogram  12/18/2023   Pap Smear  02/20/2025   DTaP/Tdap/Td vaccine (2 - Td or Tdap) 10/19/2031   DEXA scan (bone density measurement)  Completed   Hepatitis C Screening: USPSTF Recommendation to screen - Ages 54-79 yo.  Completed   HPV Vaccine  Aged Out   Flu Shot  Discontinued     Next appointment: Follow up in one year for your annual wellness visit.   Preventive Care 12 Years and Older, Female Preventive care refers to lifestyle choices and visits with your health care provider that can promote health and wellness. What does preventive care include? A yearly physical exam. This is also called an annual well check. Dental exams once or twice a year. Routine eye exams. Ask your health care provider how often you should have your eyes checked. Personal lifestyle choices, including: Daily care of your teeth and gums. Regular physical activity. Eating a healthy diet. Avoiding tobacco and drug use. Limiting alcohol use. Practicing safe sex. Taking low-dose aspirin every day. Taking vitamin and mineral supplements as  recommended by your health care provider. What happens during an annual well check? The services and screenings done by your health care provider during your annual well check will depend on your age, overall health, lifestyle risk factors, and family history of disease. Counseling  Your health care provider may ask you questions about your: Alcohol use. Tobacco use. Drug use. Emotional well-being. Home and relationship well-being. Sexual activity. Eating habits. History of falls. Memory and ability to understand (cognition). Work and work Statistician. Reproductive health. Screening  You may have the following tests or measurements: Height, weight, and BMI. Blood pressure. Lipid and cholesterol levels. These may be checked every 5 years, or more frequently if you are over 67 years old. Skin check. Lung cancer screening. You may have this screening every year starting at age 33 if you have a 30-pack-year history of smoking and currently smoke or have quit within the past 15 years. Fecal occult blood test (FOBT) of the stool. You may have this test every year starting at age 32. Flexible sigmoidoscopy or colonoscopy. You may have a sigmoidoscopy every 5 years or a colonoscopy every 10 years starting at age 5. Hepatitis C blood test. Hepatitis B blood test. Sexually transmitted disease (STD) testing. Diabetes screening. This is done by checking your blood sugar (glucose) after you have not eaten for a while (fasting). You may have this done every 1-3 years. Bone density scan.  This is done to screen for osteoporosis. You may have this done starting at age 81. Mammogram. This may be done every 1-2 years. Talk to your health care provider about how often you should have regular mammograms. Talk with your health care provider about your test results, treatment options, and if necessary, the need for more tests. Vaccines  Your health care provider may recommend certain vaccines, such  as: Influenza vaccine. This is recommended every year. Tetanus, diphtheria, and acellular pertussis (Tdap, Td) vaccine. You may need a Td booster every 10 years. Zoster vaccine. You may need this after age 73. Pneumococcal 13-valent conjugate (PCV13) vaccine. One dose is recommended after age 78. Pneumococcal polysaccharide (PPSV23) vaccine. One dose is recommended after age 18. Talk to your health care provider about which screenings and vaccines you need and how often you need them. This information is not intended to replace advice given to you by your health care provider. Make sure you discuss any questions you have with your health care provider. Document Released: 10/12/2015 Document Revised: 06/04/2016 Document Reviewed: 07/17/2015 Elsevier Interactive Patient Education  2017 [place] Prevention in the Home Falls can cause injuries. They can happen to people of all ages. There are many things you can do to make your home safe and to help prevent falls. What can I do on the outside of my home? Regularly fix the edges of walkways and driveways and fix any cracks. Remove anything that might make you trip as you walk through a door, such as a raised step or threshold. Trim any bushes or trees on the path to your home. Use bright outdoor lighting. Clear any walking paths of anything that might make someone trip, such as rocks or tools. Regularly check to see if handrails are loose or broken. Make sure that both sides of any steps have handrails. Any raised decks and porches should have guardrails on the edges. Have any leaves, snow, or ice cleared regularly. Use sand or salt on walking paths during winter. Clean up any spills in your garage right away. This includes oil or grease spills. What can I do in the bathroom? Use night lights. Install grab bars by the toilet and in the tub and shower. Do not use towel bars as grab bars. Use non-skid mats or decals in the tub or  shower. If you need to sit down in the shower, use a plastic, non-slip stool. Keep the floor dry. Clean up any water that spills on the floor as soon as it happens. Remove soap buildup in the tub or shower regularly. Attach bath mats securely with double-sided non-slip rug tape. Do not have throw rugs and other things on the floor that can make you trip. What can I do in the bedroom? Use night lights. Make sure that you have a light by your bed that is easy to reach. Do not use any sheets or blankets that are too big for your bed. They should not hang down onto the floor. Have a firm chair that has side arms. You can use this for support while you get dressed. Do not have throw rugs and other things on the floor that can make you trip. What can I do in the kitchen? Clean up any spills right away. Avoid walking on wet floors. Keep items that you use a lot in easy-to-reach places. If you need to reach something above you, use a strong step stool that has a grab bar. Keep electrical cords  out of the way. Do not use floor polish or wax that makes floors slippery. If you must use wax, use non-skid floor wax. Do not have throw rugs and other things on the floor that can make you trip. What can I do with my stairs? Do not leave any items on the stairs. Make sure that there are handrails on both sides of the stairs and use them. Fix handrails that are broken or loose. Make sure that handrails are as long as the stairways. Check any carpeting to make sure that it is firmly attached to the stairs. Fix any carpet that is loose or worn. Avoid having throw rugs at the top or bottom of the stairs. If you do have throw rugs, attach them to the floor with carpet tape. Make sure that you have a light switch at the top of the stairs and the bottom of the stairs. If you do not have them, ask someone to add them for you. What else can I do to help prevent falls? Wear shoes that: Do not have high heels. Have  rubber bottoms. Are comfortable and fit you well. Are closed at the toe. Do not wear sandals. If you use a stepladder: Make sure that it is fully opened. Do not climb a closed stepladder. Make sure that both sides of the stepladder are locked into place. Ask someone to hold it for you, if possible. Clearly mark and make sure that you can see: Any grab bars or handrails. First and last steps. Where the edge of each step is. Use tools that help you move around (mobility aids) if they are needed. These include: Canes. Walkers. Scooters. Crutches. Turn on the lights when you go into a dark area. Replace any light bulbs as soon as they burn out. Set up your furniture so you have a clear path. Avoid moving your furniture around. If any of your floors are uneven, fix them. If there are any pets around you, be aware of where they are. Review your medicines with your doctor. Some medicines can make you feel dizzy. This can increase your chance of falling. Ask your doctor what other things that you can do to help prevent falls. This information is not intended to replace advice given to you by your health care provider. Make sure you discuss any questions you have with your health care provider. Document Released: 07/12/2009 Document Revised: 02/21/2016 Document Reviewed: 10/20/2014 Elsevier Interactive Patient Education  2017 [employer].

## 2022-11-25 DIAGNOSIS — E1165 Type 2 diabetes mellitus with hyperglycemia: Secondary | ICD-10-CM | POA: Diagnosis not present

## 2022-12-05 ENCOUNTER — Ambulatory Visit: Payer: Medicare HMO | Admitting: Internal Medicine

## 2022-12-08 ENCOUNTER — Other Ambulatory Visit (HOSPITAL_BASED_OUTPATIENT_CLINIC_OR_DEPARTMENT_OTHER): Payer: Self-pay | Admitting: Family Medicine

## 2022-12-08 DIAGNOSIS — Z1231 Encounter for screening mammogram for malignant neoplasm of breast: Secondary | ICD-10-CM

## 2022-12-09 ENCOUNTER — Encounter: Payer: Self-pay | Admitting: General Practice

## 2022-12-23 ENCOUNTER — Ambulatory Visit (HOSPITAL_BASED_OUTPATIENT_CLINIC_OR_DEPARTMENT_OTHER)
Admission: RE | Admit: 2022-12-23 | Discharge: 2022-12-23 | Disposition: A | Discharge status: Home / Self Care | Payer: Medicare HMO | Source: Ambulatory Visit | Attending: Family Medicine | Admitting: Family Medicine

## 2022-12-23 ENCOUNTER — Encounter (HOSPITAL_BASED_OUTPATIENT_CLINIC_OR_DEPARTMENT_OTHER): Payer: Self-pay

## 2022-12-23 DIAGNOSIS — Z1231 Encounter for screening mammogram for malignant neoplasm of breast: Secondary | ICD-10-CM | POA: Diagnosis not present

## 2022-12-24 DIAGNOSIS — E1165 Type 2 diabetes mellitus with hyperglycemia: Secondary | ICD-10-CM | POA: Diagnosis not present

## 2022-12-25 ENCOUNTER — Other Ambulatory Visit: Payer: Self-pay | Admitting: Family Medicine

## 2022-12-25 DIAGNOSIS — R928 Other abnormal and inconclusive findings on diagnostic imaging of breast: Secondary | ICD-10-CM

## 2023-01-16 ENCOUNTER — Ambulatory Visit: Payer: Medicare HMO

## 2023-01-16 ENCOUNTER — Ambulatory Visit
Admission: RE | Admit: 2023-01-16 | Discharge: 2023-01-16 | Disposition: A | Discharge status: Home / Self Care | Payer: Medicare HMO | Source: Ambulatory Visit | Attending: Family Medicine | Admitting: Family Medicine

## 2023-01-16 DIAGNOSIS — R928 Other abnormal and inconclusive findings on diagnostic imaging of breast: Secondary | ICD-10-CM

## 2023-01-16 DIAGNOSIS — R921 Mammographic calcification found on diagnostic imaging of breast: Secondary | ICD-10-CM | POA: Diagnosis not present

## 2023-01-24 DIAGNOSIS — E1165 Type 2 diabetes mellitus with hyperglycemia: Secondary | ICD-10-CM | POA: Diagnosis not present

## 2023-02-17 ENCOUNTER — Encounter: Payer: Self-pay | Admitting: Family Medicine

## 2023-02-17 ENCOUNTER — Ambulatory Visit (INDEPENDENT_AMBULATORY_CARE_PROVIDER_SITE_OTHER): Payer: Medicare HMO | Admitting: Family Medicine

## 2023-02-17 VITALS — BP 138/90 | HR 80 | Temp 98.7°F | Resp 18 | Ht 63.5 in | Wt 183.0 lb

## 2023-02-17 DIAGNOSIS — E785 Hyperlipidemia, unspecified: Secondary | ICD-10-CM

## 2023-02-17 DIAGNOSIS — I10 Essential (primary) hypertension: Secondary | ICD-10-CM | POA: Diagnosis not present

## 2023-02-17 DIAGNOSIS — E1165 Type 2 diabetes mellitus with hyperglycemia: Secondary | ICD-10-CM | POA: Diagnosis not present

## 2023-02-17 DIAGNOSIS — E1169 Type 2 diabetes mellitus with other specified complication: Secondary | ICD-10-CM | POA: Diagnosis not present

## 2023-02-17 DIAGNOSIS — Z8249 Family history of ischemic heart disease and other diseases of the circulatory system: Secondary | ICD-10-CM

## 2023-02-17 DIAGNOSIS — Z0000 Encounter for general adult medical examination without abnormal findings: Secondary | ICD-10-CM | POA: Diagnosis not present

## 2023-02-17 LAB — CBC WITH DIFFERENTIAL/PLATELET
Basophils Absolute: 0 10*3/uL (ref 0.0–0.1)
Basophils Relative: 0.5 % (ref 0.0–3.0)
Eosinophils Absolute: 0.2 10*3/uL (ref 0.0–0.7)
Eosinophils Relative: 2.5 % (ref 0.0–5.0)
HCT: 38.5 % (ref 36.0–46.0)
Hemoglobin: 12.3 g/dL (ref 12.0–15.0)
Lymphocytes Relative: 22.8 % (ref 12.0–46.0)
Lymphs Abs: 1.6 10*3/uL (ref 0.7–4.0)
MCHC: 31.9 g/dL (ref 30.0–36.0)
MCV: 94.8 fl (ref 78.0–100.0)
Monocytes Absolute: 0.4 10*3/uL (ref 0.1–1.0)
Monocytes Relative: 5 % (ref 3.0–12.0)
Neutro Abs: 4.9 10*3/uL (ref 1.4–7.7)
Neutrophils Relative %: 69.2 % (ref 43.0–77.0)
Platelets: 284 10*3/uL (ref 150.0–400.0)
RBC: 4.06 Mil/uL (ref 3.87–5.11)
RDW: 13.7 % (ref 11.5–15.5)
WBC: 7.1 10*3/uL (ref 4.0–10.5)

## 2023-02-17 LAB — LIPID PANEL
Cholesterol: 133 mg/dL (ref 0–200)
HDL: 36.1 mg/dL — ABNORMAL LOW (ref >39.00)
LDL Cholesterol: 70 mg/dL (ref 0–99)
NonHDL: 96.42
Total CHOL/HDL Ratio: 4
Triglycerides: 130 mg/dL (ref 0.0–149.0)
VLDL: 26 mg/dL (ref 0.0–40.0)

## 2023-02-17 LAB — COMPREHENSIVE METABOLIC PANEL
ALT: 13 U/L (ref 0–35)
AST: 13 U/L (ref 0–37)
Albumin: 3.9 g/dL (ref 3.5–5.2)
Alkaline Phosphatase: 69 U/L (ref 39–117)
BUN: 17 mg/dL (ref 6–23)
CO2: 29 mEq/L (ref 19–32)
Calcium: 9.9 mg/dL (ref 8.4–10.5)
Chloride: 99 mEq/L (ref 96–112)
Creatinine, Ser: 0.89 mg/dL (ref 0.40–1.20)
GFR: 67.65 mL/min (ref >60.00)
Glucose, Bld: 200 mg/dL — ABNORMAL HIGH (ref 70–99)
Potassium: 4 mEq/L (ref 3.5–5.1)
Sodium: 137 mEq/L (ref 135–145)
Total Bilirubin: 0.7 mg/dL (ref 0.2–1.2)
Total Protein: 6.9 g/dL (ref 6.0–8.3)

## 2023-02-17 LAB — HEMOGLOBIN A1C: Hgb A1c MFr Bld: 9.8 % — ABNORMAL HIGH (ref 4.6–6.5)

## 2023-02-17 NOTE — Progress Notes (Signed)
Established Patient Office Visit  Subjective   Patient ID: Jordan Joseph, female    DOB: [birth year]  Age: 67 y.o. MRN: [id number]  Chief Complaint  Patient presents with   Annual Exam    Pt states fasting     HPI Pt is here for cpe and f/u dm, chol and bp.  HPI HYPERTENSION  Blood pressure range-not checking   Chest pain- no      Dyspnea- no Lightheadedness- no   Edema- no Other side effects - no   Medication compliance: good Low salt diet- yes  DIABETES  Blood Sugar ranges-good per pt   Polyuria- no New Visual problems- no Hypoglycemic symptoms- no Other side effects-no Medication compliance - good Last eye exam- due Foot exam- today  HYPERLIPIDEMIA  Medication compliance- good RUQ pain- no  Muscle aches- no Other side effects-no   Patient Active Problem List   Diagnosis Date Noted   Preventative health care 10/18/2021   Hyperlipidemia associated with type 2 diabetes mellitus (HCC) 04/10/2020   Hypertension 02/17/2020   Uncontrolled type 2 diabetes mellitus with hyperglycemia (HCC) 02/17/2020   Equinus deformity of foot, acquired 05/01/2014   Past Medical History:  Diagnosis Date   Diabetes mellitus type II, uncontrolled    HTN (hypertension)    Past Surgical History:  Procedure Laterality Date   CHOLECYSTECTOMY     COLONOSCOPY  10/27/2013   [name], MD. left sided diverticulosis. O/W normar screening colonoscopy.   Suburethral sling     For incontinence- 2000   Social History   Tobacco Use   Smoking status: Never   Smokeless tobacco: Never  Vaping Use   Vaping Use: Never used  Substance Use Topics   Alcohol use: Never   Drug use: Never   Social History   Socioeconomic History   Marital status: Divorced    Spouse name: Not on file   Number of children: 2   Years of education: Not on file   Highest education level: Not on file  Occupational History   Occupation: Engineer, site  Tobacco Use   Smoking status: Never    Smokeless tobacco: Never  Vaping Use   Vaping Use: Never used  Substance and Sexual Activity   Alcohol use: Never   Drug use: Never   Sexual activity: Not Currently  Other Topics Concern   Not on file  Social History Narrative   Exercise -- walking 5000 steps    15 min trampoline   15 min gazelle    Social Determinants of Health   Financial Resource Strain: Low Risk  (11/03/2022)   Overall Financial Resource Strain (CARDIA)    Difficulty of Paying Living Expenses: Not hard at all  Food Insecurity: No Food Insecurity (11/03/2022)   Hunger Vital Sign    Worried About Running Out of Food in the Last Year: Never true    Ran Out of Food in the Last Year: Never true  Transportation Needs: No Transportation Needs (11/03/2022)   PRAPARE - Administrator, Civil Service (Medical): No    Lack of Transportation (Non-Medical): No  Physical Activity: Insufficiently Active (11/03/2022)   Exercise Vital Sign    Days of Exercise per Week: 3 days    Minutes of Exercise per Session: 30 min  Stress: No Stress Concern Present (11/03/2022)   [employer] of Occupational Health - Occupational Stress Questionnaire    Feeling of Stress : Not at all  Social Connections: Moderately Integrated (  11/03/2022)   Social Connection and Isolation Panel [NHANES]    Frequency of Communication with Friends and Family: More than three times a week    Frequency of Social Gatherings with Friends and Family: More than three times a week    Attends Religious Services: More than 4 times per year    Active Member of [employer] or Organizations: Yes    Attends Engineer, structural: More than 4 times per year    Marital Status: Divorced  Intimate Partner Violence: Not At Risk (11/03/2022)   Humiliation, Afraid, Rape, and Kick questionnaire    Fear of Current or Ex-Partner: No    Emotionally Abused: No    Physically Abused: No    Sexually Abused: No   Family Status  Relation Name Status   Mother   Deceased at age 70   Father  Deceased at age 82   Brother  Alive   MGM  Deceased   MGF  Deceased at age 1   PGM  Deceased   PGF  Deceased   Neg Hx  (Not Specified)   Family History  Problem Relation Age of Onset   Alzheimer's disease Mother    Hypertension Father    Cirrhosis Father    Diabetes Mellitus II Father    Diabetes Brother    Hypertension Brother    Pulmonary embolism Brother    Stroke Brother    Colon cancer Neg Hx    Rectal cancer Neg Hx    Pancreatic cancer Neg Hx    Stomach cancer Neg Hx    Esophageal cancer Neg Hx    Liver cancer Neg Hx    No Known Allergies    Review of Systems  Constitutional:  Negative for fever and malaise/fatigue.  HENT:  Negative for congestion.   Eyes:  Negative for blurred vision.  Respiratory:  Negative for shortness of breath.   Cardiovascular:  Negative for chest pain, palpitations and leg swelling.  Gastrointestinal:  Negative for abdominal pain, blood in stool and nausea.  Genitourinary:  Negative for dysuria and frequency.  Musculoskeletal:  Negative for falls.  Skin:  Negative for rash.  Neurological:  Negative for dizziness, loss of consciousness and headaches.  Endo/Heme/Allergies:  Negative for environmental allergies.  Psychiatric/Behavioral:  Negative for depression. The patient is not nervous/anxious.       Objective:     BP (!) 138/90 (BP Location: Left Arm, Patient Position: Sitting, Cuff Size: Normal)   Pulse 80   Temp 98.7 F (37.1 C) (Oral)   Resp 18   Ht 5' 3.5" (1.613 m)   Wt 183 lb (83 kg)   LMP  (LMP Unknown)   SpO2 94%   BMI 31.91 kg/m  BP Readings from Last 3 Encounters:  02/17/23 (!) 138/90  09/16/22 107/70  08/12/22 (!) 142/100   Wt Readings from Last 3 Encounters:  02/17/23 183 lb (83 kg)  08/12/22 185 lb 3.2 oz (84 kg)  06/06/22 184 lb (83.5 kg)   SpO2 Readings from Last 3 Encounters:  02/17/23 94%  09/16/22 99%  08/12/22 97%      Physical Exam Vitals and nursing note  reviewed.  Constitutional:      General: She is not in acute distress.    Appearance: She is well-developed.  HENT:     Head: Normocephalic and atraumatic.     Right Ear: External ear normal.     Left Ear: External ear normal.     Nose: Nose normal.  Eyes:  Conjunctiva/sclera: Conjunctivae normal.     Pupils: Pupils are equal, round, and reactive to light.  Neck:     Thyroid: No thyromegaly.     Vascular: No carotid bruit or JVD.  Cardiovascular:     Rate and Rhythm: Normal rate and regular rhythm.     Heart sounds: Normal heart sounds. No murmur heard. Pulmonary:     Effort: Pulmonary effort is normal. No respiratory distress.     Breath sounds: Normal breath sounds. No wheezing or rales.  Chest:     Chest wall: No tenderness.  Musculoskeletal:        General: Normal range of motion.     Cervical back: Normal range of motion and neck supple.  Skin:    Findings: No erythema, lesion or rash.  Neurological:     General: No focal deficit present.     Mental Status: She is alert and oriented to person, place, and time.  Psychiatric:        Behavior: Behavior normal.        Thought Content: Thought content normal.        Judgment: Judgment normal.    Diabetic Foot Exam - Simple   Simple Foot Form Diabetic Foot exam was performed with the following findings: Yes 02/17/2023  4:00 PM  Visual Inspection No deformities, no ulcerations, no other skin breakdown bilaterally: Yes Sensation Testing See comments: Yes Pulse Check Posterior Tibialis and Dorsalis pulse intact bilaterally: Yes Comments Pt unable to feel the monofilament on both feet  Pt sees neuro       No results found for any visits on 02/17/23.  Last CBC Lab Results  Component Value Date   WBC 6.1 05/05/2022   HGB 13.0 05/05/2022   HCT 38.4 05/05/2022   MCV 95.9 05/05/2022   MCH 32.6 10/18/2021   RDW 13.5 05/05/2022   PLT 254.0 05/05/2022   Last metabolic panel Lab Results  Component Value Date    GLUCOSE 206 (H) 08/12/2022   NA 136 08/12/2022   K 4.0 08/12/2022   CL 101 08/12/2022   CO2 30 08/12/2022   BUN 10 08/12/2022   CREATININE 0.79 08/12/2022   CALCIUM 9.5 08/12/2022   PROT 6.9 08/12/2022   ALBUMIN 4.0 08/12/2022   BILITOT 0.8 08/12/2022   ALKPHOS 60 08/12/2022   AST 12 08/12/2022   ALT 11 08/12/2022   Last lipids Lab Results  Component Value Date   CHOL 118 08/12/2022   HDL 36.70 (L) 08/12/2022   LDLCALC 54 08/12/2022   LDLDIRECT 84.0 05/05/2022   TRIG 136.0 08/12/2022   CHOLHDL 3 08/12/2022   Last hemoglobin A1c Lab Results  Component Value Date   HGBA1C 11.1 (A) 06/06/2022   Last thyroid functions Lab Results  Component Value Date   TSH 1.76 10/18/2021   Last vitamin D No results found for: "25OHVITD2", "25OHVITD3", "VD25OH" Last vitamin B12 and Folate No results found for: "VITAMINB12", "FOLATE"    The ASCVD Risk score (Arnett DK, et al., 2019) failed to calculate for the following reasons:   The valid total cholesterol range is 130 to 320 mg/dL    Assessment & Plan:   Problem List Items Addressed This Visit       Unprioritized   Uncontrolled type 2 diabetes mellitus with hyperglycemia (HCC)    hgba1c to be checked ,  minimize simple carbs. Increase exercise as tolerated. Continue current meds       Preventative health care - Primary    Ghm utd  Check labs  See AVS Health Maintenance  Topic Date Due   OPHTHALMOLOGY EXAM  Never done   Zoster Vaccines- Shingrix (1 of 2) Never done   Pneumonia Vaccine 32+ Years old (1 of 1 - PCV) Never done   Diabetic kidney evaluation - Urine ACR  06/21/2022   HEMOGLOBIN A1C  12/05/2022   COVID-19 Vaccine (6 - 2023-24 season) 03/05/2023 (Originally 05/30/2022)   Diabetic kidney evaluation - eGFR measurement  08/13/2023   COLONOSCOPY (Pts 45-63yrs Insurance coverage will need to be confirmed)  10/28/2023   Medicare Annual Wellness (AWV)  11/04/2023   FOOT EXAM  02/17/2024   MAMMOGRAM  01/15/2025    DTaP/Tdap/Td (2 - Td or Tdap) 10/19/2031   DEXA SCAN  Completed   Hepatitis C Screening  Completed   HPV VACCINES  Aged Out   INFLUENZA VACCINE  Discontinued        Relevant Orders   Vitamin B12   VITAMIN D 25 Hydroxy (Vit-D Deficiency, Fractures)   Hypertension    Well controlled, no changes to meds. Encouraged heart healthy diet such as the DASH diet and exercise as tolerated.        Relevant Orders   CBC with Differential/Platelet   TSH   Hyperlipidemia associated with type 2 diabetes mellitus (HCC)    Tolerating statin, encouraged heart healthy diet, avoid trans fats, minimize simple carbs and saturated fats. Increase exercise as tolerated       Relevant Orders   Lipid panel   Comprehensive metabolic panel   Other Visit Diagnoses     Type 2 diabetes mellitus with other specified complication, without long-term current use of insulin (HCC)       Relevant Orders   Comprehensive metabolic panel   Hemoglobin A1c   Vitamin B12   VITAMIN D 25 Hydroxy (Vit-D Deficiency, Fractures)   Family history of blood clots       Relevant Orders   Ambulatory referral to Hematology / Oncology       Return in about 6 months (around 08/20/2023), or if symptoms worsen or fail to improve, for hypertension, hyperlipidemia.    Donato Schultz, DO

## 2023-02-17 NOTE — Assessment & Plan Note (Signed)
Ghm utd Check labs  See AVS Health Maintenance  Topic Date Due   OPHTHALMOLOGY EXAM  Never done   Zoster Vaccines- Shingrix (1 of 2) Never done   Pneumonia Vaccine 56+ Years old (1 of 1 - PCV) Never done   Diabetic kidney evaluation - Urine ACR  06/21/2022   HEMOGLOBIN A1C  12/05/2022   COVID-19 Vaccine (6 - 2023-24 season) 03/05/2023 (Originally 05/30/2022)   Diabetic kidney evaluation - eGFR measurement  08/13/2023   COLONOSCOPY (Pts 45-41yrs Insurance coverage will need to be confirmed)  10/28/2023   Medicare Annual Wellness (AWV)  11/04/2023   FOOT EXAM  02/17/2024   MAMMOGRAM  01/15/2025   DTaP/Tdap/Td (2 - Td or Tdap) 10/19/2031   DEXA SCAN  Completed   Hepatitis C Screening  Completed   HPV VACCINES  Aged Out   INFLUENZA VACCINE  Discontinued

## 2023-02-17 NOTE — Assessment & Plan Note (Signed)
hgba1c to be checked, minimize simple carbs. Increase exercise as tolerated. Continue current meds  

## 2023-02-17 NOTE — Patient Instructions (Signed)
Preventive Care 65 Years and Older, Female Preventive care refers to lifestyle choices and visits with your health care provider that can promote health and wellness. Preventive care visits are also called wellness exams. What can I expect for my preventive care visit? Counseling Your health care provider may ask you questions about your: Medical history, including: Past medical problems. Family medical history. Pregnancy and menstrual history. History of falls. Current health, including: Memory and ability to understand (cognition). Emotional well-being. Home life and relationship well-being. Sexual activity and sexual health. Lifestyle, including: Alcohol, nicotine or tobacco, and drug use. Access to firearms. Diet, exercise, and sleep habits. Work and work environment. Sunscreen use. Safety issues such as seatbelt and bike helmet use. Physical exam Your health care provider will check your: Height and weight. These may be used to calculate your BMI (body mass index). BMI is a measurement that tells if you are at a healthy weight. Waist circumference. This measures the distance around your waistline. This measurement also tells if you are at a healthy weight and may help predict your risk of certain diseases, such as type 2 diabetes and high blood pressure. Heart rate and blood pressure. Body temperature. Skin for abnormal spots. What immunizations do I need?  Vaccines are usually given at various ages, according to a schedule. Your health care provider will recommend vaccines for you based on your age, medical history, and lifestyle or other factors, such as travel or where you work. What tests do I need? Screening Your health care provider may recommend screening tests for certain conditions. This may include: Lipid and cholesterol levels. Hepatitis C test. Hepatitis B test. HIV (human immunodeficiency virus) test. STI (sexually transmitted infection) testing, if you are at  risk. Lung cancer screening. Colorectal cancer screening. Diabetes screening. This is done by checking your blood sugar (glucose) after you have not eaten for a while (fasting). Mammogram. Talk with your health care provider about how often you should have regular mammograms. BRCA-related cancer screening. This may be done if you have a family history of breast, ovarian, tubal, or peritoneal cancers. Bone density scan. This is done to screen for osteoporosis. Talk with your health care provider about your test results, treatment options, and if necessary, the need for more tests. Follow these instructions at home: Eating and drinking  Eat a diet that includes fresh fruits and vegetables, whole grains, lean protein, and low-fat dairy products. Limit your intake of foods with high amounts of sugar, saturated fats, and salt. Take vitamin and mineral supplements as recommended by your health care provider. Do not drink alcohol if your health care provider tells you not to drink. If you drink alcohol: Limit how much you have to 0-1 drink a day. Know how much alcohol is in your drink. In the [place] one drink equals one 12 oz bottle of beer (355 mL), one 5 oz glass of wine (148 mL), or one 1 oz glass of hard liquor (44 mL). Lifestyle Brush your teeth every morning and night with fluoride toothpaste. Floss one time each day. Exercise for at least 30 minutes 5 or more days each week. Do not use any products that contain nicotine or tobacco. These products include cigarettes, chewing tobacco, and vaping devices, such as e-cigarettes. If you need help quitting, ask your health care provider. Do not use drugs. If you are sexually active, practice safe sex. Use a condom or other form of protection in order to prevent STIs. Take aspirin only as told by   your health care provider. Make sure that you understand how much to take and what form to take. Work with your health care provider to find out whether it  is safe and beneficial for you to take aspirin daily. Ask your health care provider if you need to take a cholesterol-lowering medicine (statin). Find healthy ways to manage stress, such as: Meditation, yoga, or listening to music. Journaling. Talking to a trusted person. Spending time with friends and family. Minimize exposure to UV radiation to reduce your risk of skin cancer. Safety Always wear your seat belt while driving or riding in a vehicle. Do not drive: If you have been drinking alcohol. Do not ride with someone who has been drinking. When you are tired or distracted. While texting. If you have been using any mind-altering substances or drugs. Wear a helmet and other protective equipment during sports activities. If you have firearms in your house, make sure you follow all gun safety procedures. What's next? Visit your health care provider once a year for an annual wellness visit. Ask your health care provider how often you should have your eyes and teeth checked. Stay up to date on all vaccines. This information is not intended to replace advice given to you by your health care provider. Make sure you discuss any questions you have with your health care provider. Document Revised: 03/13/2021 Document Reviewed: 03/13/2021 Elsevier Patient Education  2023 [employer]   

## 2023-02-17 NOTE — Assessment & Plan Note (Signed)
Tolerating statin, encouraged heart healthy diet, avoid trans fats, minimize simple carbs and saturated fats. Increase exercise as tolerated 

## 2023-02-17 NOTE — Assessment & Plan Note (Signed)
Well controlled, no changes to meds. Encouraged heart healthy diet such as the DASH diet and exercise as tolerated.  °

## 2023-02-18 LAB — VITAMIN D 25 HYDROXY (VIT D DEFICIENCY, FRACTURES): VITD: 29.56 ng/mL — ABNORMAL LOW (ref 30.00–100.00)

## 2023-02-18 LAB — TSH: TSH: 1.8 u[IU]/mL (ref 0.35–5.50)

## 2023-02-18 LAB — VITAMIN B12: Vitamin B-12: >1500 pg/mL — ABNORMAL HIGH (ref 211–911)

## 2023-02-24 DIAGNOSIS — E1165 Type 2 diabetes mellitus with hyperglycemia: Secondary | ICD-10-CM | POA: Diagnosis not present

## 2023-02-27 ENCOUNTER — Encounter: Payer: Self-pay | Admitting: Internal Medicine

## 2023-02-27 ENCOUNTER — Other Ambulatory Visit: Payer: Self-pay | Admitting: Family

## 2023-02-27 ENCOUNTER — Ambulatory Visit: Payer: Medicare HMO | Admitting: Internal Medicine

## 2023-02-27 VITALS — BP 124/80 | HR 75 | Ht 63.8 in | Wt 183.8 lb

## 2023-02-27 DIAGNOSIS — E1165 Type 2 diabetes mellitus with hyperglycemia: Secondary | ICD-10-CM

## 2023-02-27 DIAGNOSIS — Z832 Family history of diseases of the blood and blood-forming organs and certain disorders involving the immune mechanism: Secondary | ICD-10-CM

## 2023-02-27 DIAGNOSIS — Z794 Long term (current) use of insulin: Secondary | ICD-10-CM | POA: Diagnosis not present

## 2023-02-27 DIAGNOSIS — Z7985 Long-term (current) use of injectable non-insulin antidiabetic drugs: Secondary | ICD-10-CM | POA: Diagnosis not present

## 2023-02-27 DIAGNOSIS — Z7984 Long term (current) use of oral hypoglycemic drugs: Secondary | ICD-10-CM | POA: Diagnosis not present

## 2023-02-27 MED ORDER — SEMAGLUTIDE(0.25 OR 0.5MG/DOS) 2 MG/3ML ~~LOC~~ SOPN: 0.5000 mg | PEN_INJECTOR | SUBCUTANEOUS | 11 refills | Status: DC

## 2023-02-27 NOTE — Patient Instructions (Addendum)
-   Start Ozempic 0.25 mg weekly for 6 weeks, than increase to 0.5 mg weekly  - Continue  Farxiga 10 mg, 1 tablet daily  - Continue  tresiba 30 units daily  -Continue metformin 500 mg, 2 tabs daily   HOW TO TREAT LOW BLOOD SUGARS (Blood sugar LESS THAN 70 MG/DL) Please follow the RULE OF 15 for the treatment of hypoglycemia treatment (when your (blood sugars are less than 70 mg/dL)   STEP 1: Take 15 grams of carbohydrates when your blood sugar is low, which includes:  3-4 GLUCOSE TABS  OR 3-4 OZ OF JUICE OR REGULAR SODA OR ONE TUBE OF GLUCOSE GEL    STEP 2: RECHECK blood sugar in 15 MINUTES STEP 3: If your blood sugar is still low at the 15 minute recheck --> then, go back to STEP 1 and treat AGAIN with another 15 grams of carbohydrates.

## 2023-02-27 NOTE — Progress Notes (Signed)
Name: Jordan Joseph  Age/ Sex: 67 y.o., female   MRN/ DOB: [id number], 19-Jul-1956     PCP: Donato Schultz, DO   Reason for Endocrinology Evaluation: Type 2 Diabetes Mellitus  Initial Endocrine Consultative Visit: 03/06/2020    PATIENT IDENTIFIER: Jordan Joseph is a 67 y.o. female with a past medical history of HTN and T2DM. The patient has followed with Endocrinology clinic since 03/06/2020 for consultative assistance with management of her diabetes.  DIABETIC HISTORY:  Ms. Tainter was diagnosed with T2DM in 2013. Marland Kitchen Her hemoglobin A1c has ranged from 11.1% in 04/2020, peaking at 13.5 % in 2021  ON her initial visit she was on basal insulin and Glipizide. We stopped Glipizide, started Metformin and increased basal insulin  She was last to follow-up for a year until her return in 05/2022 with an A1c of 11.1%.  She was not taking metformin due to skepticism about side effects so I take it off her list, we continue [place] and [place]  She met with our CDE for Dexcom G7 training 09/2022  SUBJECTIVE:   During the last visit (06/06/2022): A1c 11.1 %    Today (02/27/2023): Jordan Joseph is here for a follow up on diabetes management.   She checks her blood sugars multiple times daily through CGM, no hypoglycemia  Denies nausea , vomiting  Denies constipation or diarrhea    HOME DIABETES REGIMEN:  Metformin 500 mg, 2 tabs daily  Tresiba 30 units daily  Farxiga 10 mg daily      Statin: Yes ACE-I/ARB: Yes     CONTINUOUS GLUCOSE MONITORING RECORD INTERPRETATION    Dates of Recording: 5/18-5/31/2024  Sensor description:dexcom  Results statistics:   CGM use % of time 71  Average and SD 226/61  Time in range     25   %  % Time Above 180 39  % Time above 250 36  % Time Below target 0   Glycemic patterns summary: Bg's trend down at night, increase during the day   Hyperglycemic episodes  postprandial  Hypoglycemic episodes occurred n/a  Overnight periods: trends  down    DIABETIC COMPLICATIONS: Microvascular complications:   Denies: CKD, retinopathy, neuropathy  Last Eye Exam: Completed 2020  Macrovascular complications:   Denies: CAD, CVA, PVD   HISTORY:  Past Medical History:  Past Medical History:  Diagnosis Date   Diabetes mellitus type II, uncontrolled    HTN (hypertension)    Past Surgical History:  Past Surgical History:  Procedure Laterality Date   CHOLECYSTECTOMY     COLONOSCOPY  10/27/2013   [name], MD. left sided diverticulosis. O/W normar screening colonoscopy.   Suburethral sling     For incontinence- 2000   Social History:  reports that she has never smoked. She has never used smokeless tobacco. She reports that she does not drink alcohol and does not use drugs. Family History:  Family History  Problem Relation Age of Onset   Alzheimer's disease Mother    Hypertension Father    Cirrhosis Father    Diabetes Mellitus II Father    Diabetes Brother    Hypertension Brother    Pulmonary embolism Brother    Stroke Brother    Colon cancer Neg Hx    Rectal cancer Neg Hx    Pancreatic cancer Neg Hx    Stomach cancer Neg Hx    Esophageal cancer Neg Hx    Liver cancer Neg Hx      HOME MEDICATIONS: Allergies as  of 02/27/2023   No Known Allergies      Medication List        Accurate as of Feb 27, 2023  1:17 PM. If you have any questions, ask your nurse or doctor.          amLODipine 10 MG tablet Commonly known as: NORVASC Take 1 tablet (10 mg total) by mouth daily.   BD Pen Needle Micro U/F 32G X 6 MM Misc Generic drug: Insulin Pen Needle 1 Device by Does not apply route daily in the afternoon.   carvedilol 25 MG tablet Commonly known as: COREG Take 1 tablet (25 mg total) by mouth 2 (two) times daily with a meal.   cloNIDine 0.1 MG tablet Commonly known as: CATAPRES Take 1 tablet (0.1 mg total) by mouth 2 (two) times daily.   dapagliflozin propanediol 10 MG Tabs tablet Commonly known  as: Farxiga Take 1 tablet (10 mg total) by mouth daily before breakfast.   Dexcom G7 Sensor Misc Change sensor every 10 days   FREESTYLE LITE test strip Generic drug: glucose blood 1 each by Other route daily in the afternoon. Use as instructed   losartan-hydrochlorothiazide 100-25 MG tablet Commonly known as: HYZAAR Take 1 tablet by mouth daily.   simvastatin 20 MG tablet Commonly known as: ZOCOR Take 1 tablet (20 mg total) by mouth at bedtime.   [name] FlexTouch 100 UNIT/ML FlexTouch Pen Generic drug: insulin degludec Inject 30 Units into the skin daily.   Trospium Chloride 60 MG Cp24 Take 1 capsule (60 mg total) by mouth daily.         OBJECTIVE:   Vital Signs: BP 124/80 (BP Location: Left Arm, Patient Position: Sitting, Cuff Size: Large)   Pulse 75   Ht 5' 3.8" (1.621 m)   Wt 183 lb 12.8 oz (83.4 kg)   LMP  (LMP Unknown)   SpO2 95%   BMI 31.75 kg/m   Wt Readings from Last 3 Encounters:  02/27/23 183 lb 12.8 oz (83.4 kg)  02/17/23 183 lb (83 kg)  08/12/22 185 lb 3.2 oz (84 kg)     Exam: General: Pt appears well and is in NAD  Lungs: Clear with good BS bilat   Heart: RRR  Extremities: No pretibial edema.   Neuro: MS is good with appropriate affect, pt is alert and Ox3    DM foot exam: 02/27/2023  The skin of the feet is intact without sores or ulcerations. The pedal pulses are 2+ on right and 2+ on left. The sensation is intact to a screening 5.07, 10 gram monofilament bilaterally    DATA REVIEWED:  Lab Results  Component Value Date   HGBA1C 9.8 (H) 02/17/2023   HGBA1C 11.1 (A) 06/06/2022   HGBA1C 10.0 (A) 06/25/2021    Latest Reference Range & Units 02/17/23 13:39  Sodium 135 - 145 mEq/L 137  Potassium 3.5 - 5.1 mEq/L 4.0  Chloride 96 - 112 mEq/L 99  CO2 19 - 32 mEq/L 29  Glucose 70 - 99 mg/dL 914 (H)  BUN 6 - 23 mg/dL 17  Creatinine 7.82 - 9.56 mg/dL 2.13  Calcium 8.4 - 08.6 mg/dL 9.9  Alkaline Phosphatase 39 - 117 U/L 69  Albumin  3.5 - 5.2 g/dL 3.9  AST 0 - 37 U/L 13  ALT 0 - 35 U/L 13  Total Protein 6.0 - 8.3 g/dL 6.9  Total Bilirubin 0.2 - 1.2 mg/dL 0.7  GFR >57.84 mL/min 67.65  Total CHOL/HDL Ratio  4  Cholesterol  0 - 200 mg/dL 161  HDL Cholesterol >09.60 mg/dL 45.40 (L)  LDL (calc) 0 - 99 mg/dL 70  NonHDL  98.11  Triglycerides 0.0 - 149.0 mg/dL 914.7  VLDL 0.0 - 82.9 mg/dL 56.2  (H): Data is abnormally high (L): Data is abnormally low  ASSESSMENT / PLAN / RECOMMENDATIONS:   1) Type 2 Diabetes Mellitus, Poorly controlled, Without complications - Most recent A1c of 9.8 %. Goal A1c < 7.0 %.    -A1c has trended down but continues to be above goal -Patient has been noted with postprandial hyperglycemia -She initially self discontinued metformin due to skepticism about side effects, but she had restarted at 2 tablets a day -She has been working on lifestyle changes, she is going to schedule an appointment with our CDE for further education -I have recommended GLP-1 agonist, caution against GI side effects    MEDICATIONS: Continue Tresiba 30 units daily  Continue Farxiga 10 mg , 1 tablet daily  Continue metformin 500 mg, 2 tabs daily Start Ozempic 0.25 mg weekly, for 6 weeks, then increase to 0.5 mg weekly   EDUCATION / INSTRUCTIONS: BG monitoring instructions: Patient is instructed to check her blood sugars 1 times a day, fasting  Call [facility] if: BG persistently < 70  I reviewed the Rule of 15 for the treatment of hypoglycemia in detail with the patient. Literature supplied.   2) Diabetic complications:  Eye: Does not have known diabetic retinopathy.  Neuro/ Feet: Does not have known diabetic peripheral neuropathy .  Renal: Patient does not have known baseline CKD. She   is  on an ACEI/ARB at present.     F/U in 4 months    Signed electronically by: Lyndle Herrlich, MD  [facility]  [facility] [address] [place] [place], [place] [address] Phone: [phone] FAX: [phone]   CC: Virgina Organ [address] [place] [address] Phone: [phone]  Fax: [phone]  Return to Endocrinology clinic as below: Future Appointments  Date Time Provider Department Center  03/02/2023  3:00 PM [facility] None  03/02/2023  3:15 PM Erenest Blank, NP [facility] None  06/23/2023  1:00 PM Marguerita Beards, MD [facility]  08/25/2023 11:20 AM Donato Schultz, DO [facility]

## 2023-03-02 ENCOUNTER — Other Ambulatory Visit: Payer: Self-pay

## 2023-03-02 ENCOUNTER — Encounter: Payer: Self-pay | Admitting: Family

## 2023-03-02 ENCOUNTER — Inpatient Hospital Stay: Payer: Medicare HMO | Attending: Hematology & Oncology

## 2023-03-02 ENCOUNTER — Inpatient Hospital Stay: Payer: Medicare HMO | Admitting: Family

## 2023-03-02 VITALS — BP 183/85 | HR 81 | Temp 99.2°F | Resp 19 | Ht 63.8 in | Wt 185.0 lb

## 2023-03-02 DIAGNOSIS — I10 Essential (primary) hypertension: Secondary | ICD-10-CM | POA: Insufficient documentation

## 2023-03-02 DIAGNOSIS — Z832 Family history of diseases of the blood and blood-forming organs and certain disorders involving the immune mechanism: Secondary | ICD-10-CM | POA: Diagnosis not present

## 2023-03-02 DIAGNOSIS — E119 Type 2 diabetes mellitus without complications: Secondary | ICD-10-CM | POA: Insufficient documentation

## 2023-03-02 DIAGNOSIS — Z1389 Encounter for screening for other disorder: Secondary | ICD-10-CM | POA: Diagnosis not present

## 2023-03-02 DIAGNOSIS — Z803 Family history of malignant neoplasm of breast: Secondary | ICD-10-CM | POA: Diagnosis not present

## 2023-03-02 DIAGNOSIS — N96 Recurrent pregnancy loss: Secondary | ICD-10-CM | POA: Insufficient documentation

## 2023-03-02 LAB — CMP (CANCER CENTER ONLY)
ALT: 13 U/L (ref 0–44)
AST: 10 U/L — ABNORMAL LOW (ref 15–41)
Albumin: 4.3 g/dL (ref 3.5–5.0)
Alkaline Phosphatase: 56 U/L (ref 38–126)
Anion gap: 4 — ABNORMAL LOW (ref 5–15)
BUN: 15 mg/dL (ref 8–23)
CO2: 29 mmol/L (ref 22–32)
Calcium: 9.6 mg/dL (ref 8.9–10.3)
Chloride: 102 mmol/L (ref 98–111)
Creatinine: 0.81 mg/dL (ref 0.44–1.00)
GFR, Estimated: >60 mL/min (ref >60)
Glucose, Bld: 365 mg/dL — ABNORMAL HIGH (ref 70–99)
Potassium: 4.3 mmol/L (ref 3.5–5.1)
Sodium: 135 mmol/L (ref 135–145)
Total Bilirubin: 0.4 mg/dL (ref 0.3–1.2)
Total Protein: 7.5 g/dL (ref 6.5–8.1)

## 2023-03-02 LAB — CBC WITH DIFFERENTIAL (CANCER CENTER ONLY)
Abs Immature Granulocytes: 0.03 10*3/uL (ref 0.00–0.07)
Basophils Absolute: 0 10*3/uL (ref 0.0–0.1)
Basophils Relative: 0 %
Eosinophils Absolute: 0.2 10*3/uL (ref 0.0–0.5)
Eosinophils Relative: 4 %
HCT: 35.3 % — ABNORMAL LOW (ref 36.0–46.0)
Hemoglobin: 11.6 g/dL — ABNORMAL LOW (ref 12.0–15.0)
Immature Granulocytes: 1 %
Lymphocytes Relative: 25 %
Lymphs Abs: 1.5 10*3/uL (ref 0.7–4.0)
MCH: 31.2 pg (ref 26.0–34.0)
MCHC: 32.9 g/dL (ref 30.0–36.0)
MCV: 94.9 fL (ref 80.0–100.0)
Monocytes Absolute: 0.4 10*3/uL (ref 0.1–1.0)
Monocytes Relative: 6 %
Neutro Abs: 3.9 10*3/uL (ref 1.7–7.7)
Neutrophils Relative %: 64 %
Platelet Count: 273 10*3/uL (ref 150–400)
RBC: 3.72 MIL/uL — ABNORMAL LOW (ref 3.87–5.11)
RDW: 12.6 % (ref 11.5–15.5)
WBC Count: 6.1 10*3/uL (ref 4.0–10.5)
nRBC: 0 % (ref 0.0–0.2)

## 2023-03-02 LAB — ANTITHROMBIN III: AntiThromb III Func: 111 % (ref 75–120)

## 2023-03-02 NOTE — Progress Notes (Signed)
Hematology/Oncology Consultation   Name: Jordan Joseph      MRN: [id number]    Location: Room/bed info not found  Date: 03/02/2023 Time:3:11 PM   REFERRING PHYSICIAN: Seabron Spates, DO  REASON FOR CONSULT: Family history of blood clots   DIAGNOSIS: Family history of bilateral DVTs and PEs (brother)  HISTORY OF PRESENT ILLNESS: [name] is a 67 yo female with family history of PE. Her brother was diagnosed bilateral DVT's and PE's and states that it is hereditary. She is unsure of what clotting disorder he carries. Hyper coag completed on patient and results currently pending.  No other known family history. No personal history of thrombotic event.  No hormone replacement therapy use.  She has 2 adult children and history of 1 miscarriage prior to 8 weeks.  Her sister passed away from complications with Lupus.  Her brother also has history of stroke.  She states that she had her bladder tacked without any complications.  No personal history of cancer. Her maternal grandmother had breast and paternal grandfather had prostate.  No history of thyroid disease.  She has type II diabetes as well as history of HTN.  She had her mammogram this past April and results were negative.  Colonoscopy was completed back in January 2015 and was only noted to have some left sided diverticulosis. She is due again in 2025.  No fever, chills, n/v, cough, rash, dizziness, SOB, chest pain, palpitations, abdominal pain or changes in bowel or bladder habits.  No swelling, tenderness, numbness or tingling in her extremities.  No falls or syncope.  No smoking, ETOH or recreational drug use.  Appetite and hydration are good. Weight is stable at 185 lbs.  She is a Geophysicist/field seismologist. This keeps her busy along with her sweet family.   ROS: All other 10 point review of systems is negative.   PAST MEDICAL HISTORY:   Past Medical History:  Diagnosis Date   Diabetes mellitus type II, uncontrolled    HTN  (hypertension)     ALLERGIES: No Known Allergies    MEDICATIONS:  Current Outpatient Medications on File Prior to Visit  Medication Sig Dispense Refill   amLODipine (NORVASC) 10 MG tablet Take 1 tablet (10 mg total) by mouth daily. 90 tablet 1   carvedilol (COREG) 25 MG tablet Take 1 tablet (25 mg total) by mouth 2 (two) times daily with a meal. 180 tablet 3   cloNIDine (CATAPRES) 0.1 MG tablet Take 1 tablet (0.1 mg total) by mouth 2 (two) times daily. 60 tablet 3   Continuous Blood Gluc Sensor (DEXCOM G7 SENSOR) MISC Change sensor every 10 days 3 each 6   dapagliflozin propanediol (FARXIGA) 10 MG TABS tablet Take 1 tablet (10 mg total) by mouth daily before breakfast. 90 tablet 3   glucose blood (FREESTYLE LITE) test strip 1 each by Other route daily in the afternoon. Use as instructed 90 each 3   insulin degludec (TRESIBA FLEXTOUCH) 100 UNIT/ML FlexTouch Pen Inject 30 Units into the skin daily. 30 mL 3   Insulin Pen Needle (BD PEN NEEDLE MICRO U/F) 32G X 6 MM MISC 1 Device by Does not apply route daily in the afternoon. 100 each 3   losartan-hydrochlorothiazide (HYZAAR) 100-25 MG tablet Take 1 tablet by mouth daily. 90 tablet 1   metFORMIN (GLUCOPHAGE-XR) 500 MG 24 hr tablet Take 1,000 mg by mouth daily with breakfast.     Semaglutide,0.25 or 0.5MG /DOS, 2 MG/3ML SOPN Inject 0.5 mg into the skin once  a week. 3 mL 11   simvastatin (ZOCOR) 20 MG tablet Take 1 tablet (20 mg total) by mouth at bedtime. 90 tablet 1   Trospium Chloride 60 MG CP24 Take 1 capsule (60 mg total) by mouth daily. 30 capsule 5   No current facility-administered medications on file prior to visit.     PAST SURGICAL HISTORY Past Surgical History:  Procedure Laterality Date   CHOLECYSTECTOMY     COLONOSCOPY  10/27/2013   [name], MD. left sided diverticulosis. O/W normar screening colonoscopy.   Suburethral sling     For incontinence- 2000    FAMILY HISTORY: Family History  Problem Relation Age of  Onset   Alzheimer's disease Mother    Hypertension Father    Cirrhosis Father    Diabetes Mellitus II Father    Diabetes Brother    Hypertension Brother    Pulmonary embolism Brother    Stroke Brother    Colon cancer Neg Hx    Rectal cancer Neg Hx    Pancreatic cancer Neg Hx    Stomach cancer Neg Hx    Esophageal cancer Neg Hx    Liver cancer Neg Hx     SOCIAL HISTORY:  reports that she has never smoked. She has never used smokeless tobacco. She reports that she does not drink alcohol and does not use drugs.  PERFORMANCE STATUS: The patient's performance status is 1 - Symptomatic but completely ambulatory  PHYSICAL EXAM: Most Recent Vital Signs: There were no vitals taken for this visit. LMP  (LMP Unknown)   General Appearance:    Alert, cooperative, no distress, appears stated age  Head:    Normocephalic, without obvious abnormality, atraumatic  Eyes:    PERRL, conjunctiva/corneas clear, EOM's intact, fundi    benign, both eyes        Throat:   Lips, mucosa, and tongue normal; teeth and gums normal  Neck:   Supple, symmetrical, trachea midline, no adenopathy;    thyroid:  no enlargement/tenderness/nodules; no carotid   bruit or JVD  Back:     Symmetric, no curvature, ROM normal, no CVA tenderness  Lungs:     Clear to auscultation bilaterally, respirations unlabored  Chest Wall:    No tenderness or deformity   Heart:    Regular rate and rhythm, S1 and S2 normal, no murmur, rub   or gallop     Abdomen:     Soft, non-tender, bowel sounds active all four quadrants,    no masses, no organomegaly        Extremities:   Extremities normal, atraumatic, no cyanosis or edema  Pulses:   2+ and symmetric all extremities  Skin:   Skin color, texture, turgor normal, no rashes or lesions  Lymph nodes:   Cervical, supraclavicular, and axillary nodes normal  Neurologic:   CNII-XII intact, normal strength, sensation and reflexes    throughout    LABORATORY DATA:  No results found  for this or any previous visit (from the past 48 hour(s)).    RADIOGRAPHY: No results found.     PATHOLOGY: None  ASSESSMENT/PLAN: [name] is a 67 yo female with family history of PE. Her brother was diagnosed bilateral DVT's and PE's and states that it is hereditary.  She would like to be checked to see if she also is a carrier.  Hyper coag panel pending.  Follow-up pending results.   All questions were answered. The patient knows to call the clinic with any problems, questions  or concerns. We can certainly see the patient much sooner if necessary.  The patient was discussed with Dr. Myna Hidalgo and he is in agreement with the aforementioned.   Eileen Stanford, NP

## 2023-03-03 LAB — PROTEIN C ACTIVITY: Protein C Activity: 141 % (ref 73–180)

## 2023-03-03 LAB — PROTEIN S, TOTAL: Protein S Ag, Total: 121 % (ref 60–150)

## 2023-03-03 LAB — PROTEIN S ACTIVITY: Protein S Activity: 97 % (ref 63–140)

## 2023-03-03 LAB — LUPUS ANTICOAGULANT PANEL
DRVVT: 29.6 s (ref 0.0–47.0)
PTT Lupus Anticoagulant: 28.4 s (ref 0.0–43.5)

## 2023-03-04 LAB — CARDIOLIPIN ANTIBODIES, IGG, IGM, IGA
Anticardiolipin IgA: <9 APL U/mL (ref 0–11)
Anticardiolipin IgG: <9 GPL U/mL (ref 0–14)
Anticardiolipin IgM: <9 MPL U/mL (ref 0–12)

## 2023-03-04 LAB — BETA-2-GLYCOPROTEIN I ABS, IGG/M/A
Beta-2 Glyco I IgG: <9 GPI IgG units (ref 0–20)
Beta-2-Glycoprotein I IgA: <9 GPI IgA units (ref 0–25)
Beta-2-Glycoprotein I IgM: <9 GPI IgM units (ref 0–32)

## 2023-03-04 LAB — PROTEIN C, TOTAL: Protein C, Total: 123 % (ref 60–150)

## 2023-03-04 LAB — HOMOCYSTEINE: Homocysteine: 7.9 umol/L (ref 0.0–17.2)

## 2023-03-13 LAB — PROTHROMBIN GENE MUTATION

## 2023-03-20 LAB — FACTOR 5 LEIDEN

## 2023-03-23 ENCOUNTER — Telehealth: Payer: Self-pay | Admitting: Family

## 2023-03-23 ENCOUNTER — Telehealth: Payer: Self-pay | Admitting: *Deleted

## 2023-03-23 NOTE — Telephone Encounter (Signed)
Per Maralyn Sago - Just got her labs back and no follow-up needed!

## 2023-03-23 NOTE — Telephone Encounter (Signed)
Left voicemail with call back number. Hypercoag panel was negative. No follow-up needed at this time. We can certainly see her again for any future heme/onc issues that may arise.

## 2023-03-26 DIAGNOSIS — E1165 Type 2 diabetes mellitus with hyperglycemia: Secondary | ICD-10-CM | POA: Diagnosis not present

## 2023-04-25 DIAGNOSIS — E1165 Type 2 diabetes mellitus with hyperglycemia: Secondary | ICD-10-CM | POA: Diagnosis not present

## 2023-05-26 DIAGNOSIS — E1165 Type 2 diabetes mellitus with hyperglycemia: Secondary | ICD-10-CM | POA: Diagnosis not present

## 2023-06-08 ENCOUNTER — Encounter: Payer: Self-pay | Admitting: Internal Medicine

## 2023-06-08 ENCOUNTER — Ambulatory Visit: Payer: Medicare HMO | Admitting: Internal Medicine

## 2023-06-08 VITALS — BP 128/86 | HR 60 | Ht 63.6 in | Wt 181.0 lb

## 2023-06-08 DIAGNOSIS — E1165 Type 2 diabetes mellitus with hyperglycemia: Secondary | ICD-10-CM | POA: Diagnosis not present

## 2023-06-08 DIAGNOSIS — Z7985 Long-term (current) use of injectable non-insulin antidiabetic drugs: Secondary | ICD-10-CM | POA: Diagnosis not present

## 2023-06-08 DIAGNOSIS — Z7984 Long term (current) use of oral hypoglycemic drugs: Secondary | ICD-10-CM | POA: Diagnosis not present

## 2023-06-08 LAB — POCT GLYCOSYLATED HEMOGLOBIN (HGB A1C): Hemoglobin A1C: 7.8 % — AB (ref 4.0–5.6)

## 2023-06-08 MED ORDER — DAPAGLIFLOZIN PROPANEDIOL 10 MG PO TABS: 10.0000 mg | ORAL_TABLET | Freq: Every day | ORAL | 3 refills | Status: DC

## 2023-06-08 MED ORDER — SEMAGLUTIDE (1 MG/DOSE) 4 MG/3ML ~~LOC~~ SOPN: 1.0000 mg | PEN_INJECTOR | SUBCUTANEOUS | 6 refills | Status: DC

## 2023-06-08 NOTE — Progress Notes (Signed)
Name: Jordan Joseph  Age/ Sex: 67 y.o., female   MRN/ DOB: [id number], 1956/06/02     PCP: Donato Schultz, DO   Reason for Endocrinology Evaluation: Type 2 Diabetes Mellitus  Initial Endocrine Consultative Visit: 03/06/2020    PATIENT IDENTIFIER: [name] is a 67 y.o. female with a past medical history of HTN and T2DM. The patient has followed with Endocrinology clinic since 03/06/2020 for consultative assistance with management of her diabetes.  DIABETIC HISTORY:  Jordan Joseph was diagnosed with T2DM in 2013. Marland Kitchen Her hemoglobin A1c has ranged from 11.1% in 04/2020, peaking at 13.5 % in 2021  ON her initial visit she was on basal insulin and Glipizide. We stopped Glipizide, started Metformin and increased basal insulin  She was last to follow-up for a year until her return in 05/2022 with an A1c of 11.1%.  She was not taking metformin due to skepticism about side effects so I take it off her list, we continue [place] and [place]  She met with our CDE for Dexcom G7 training 09/2022   Started Ozempic 01/2023  SUBJECTIVE:   During the last visit (02/27/2023): A1c 9.8%    Today (06/08/2023): Jordan Joseph is here for a follow up on diabetes management.   She checks her blood sugars multiple times daily through CGM, no hypoglycemia   Patient self discontinued metformin due to reading about it online  Denies nausea , vomiting  Has noted constipation but no diarrhea    HOME DIABETES REGIMEN:  Metformin 500 mg, 2 tabs daily -not taking Farxiga 10 mg daily Ozempic 0.5 mg weekly Tresiba 30 units daily       Statin: Yes ACE-I/ARB: Yes     CONTINUOUS GLUCOSE MONITORING RECORD INTERPRETATION    Dates of Recording: 8/27-06/08/2023  Sensor description:dexcom  Results statistics:   CGM use % of time 100  Average and SD 209/47  Time in range   28  %  % Time Above 180 52  % Time above 250 20  % Time Below target 0   Glycemic patterns summary: BG's trend down overnight but  remain above goal, with worsening glycemic control during the day  Hyperglycemic episodes  postprandial  Hypoglycemic episodes occurred n/a  Overnight periods: trends down    DIABETIC COMPLICATIONS: Microvascular complications:   Denies: CKD, retinopathy, neuropathy  Last Eye Exam: Completed 2020  Macrovascular complications:   Denies: CAD, CVA, PVD   HISTORY:  Past Medical History:  Past Medical History:  Diagnosis Date   Diabetes mellitus type II, uncontrolled    HTN (hypertension)    Past Surgical History:  Past Surgical History:  Procedure Laterality Date   CHOLECYSTECTOMY     COLONOSCOPY  10/27/2013   [name], MD. left sided diverticulosis. O/W normar screening colonoscopy.   Suburethral sling     For incontinence- 2000   Social History:  reports that she has never smoked. She has never used smokeless tobacco. She reports that she does not drink alcohol and does not use drugs. Family History:  Family History  Problem Relation Age of Onset   Alzheimer's disease Mother    Hypertension Father    Cirrhosis Father    Diabetes Mellitus II Father    Diabetes Brother    Hypertension Brother    Pulmonary embolism Brother    Stroke Brother    Colon cancer Neg Hx    Rectal cancer Neg Hx    Pancreatic cancer Neg Hx    Stomach cancer  Neg Hx    Esophageal cancer Neg Hx    Liver cancer Neg Hx      HOME MEDICATIONS: Allergies as of 06/08/2023   No Known Allergies      Medication List        Accurate as of June 08, 2023  2:53 PM. If you have any questions, ask your nurse or doctor.          STOP taking these medications    metFORMIN 500 MG 24 hr tablet Commonly known as: GLUCOPHAGE-XR Stopped by: Johnney Ou Mohd Clemons   Semaglutide(0.25 or 0.5MG /DOS) 2 MG/3ML Sopn Replaced by: Semaglutide (1 MG/DOSE) 4 MG/3ML Sopn Stopped by: Johnney Ou Brockton Mckesson       TAKE these medications    amLODipine 10 MG tablet Commonly known as:  NORVASC Take 1 tablet (10 mg total) by mouth daily.   BD Pen Needle Micro U/F 32G X 6 MM Misc Generic drug: Insulin Pen Needle 1 Device by Does not apply route daily in the afternoon.   carvedilol 25 MG tablet Commonly known as: COREG Take 1 tablet (25 mg total) by mouth 2 (two) times daily with a meal.   cloNIDine 0.1 MG tablet Commonly known as: CATAPRES Take 1 tablet (0.1 mg total) by mouth 2 (two) times daily.   dapagliflozin propanediol 10 MG Tabs tablet Commonly known as: Farxiga Take 1 tablet (10 mg total) by mouth daily before breakfast.   Dexcom G7 Sensor Misc Change sensor every 10 days   FREESTYLE LITE test strip Generic drug: glucose blood 1 each by Other route daily in the afternoon. Use as instructed   losartan-hydrochlorothiazide 100-25 MG tablet Commonly known as: HYZAAR Take 1 tablet by mouth daily.   Semaglutide (1 MG/DOSE) 4 MG/3ML Sopn Inject 1 mg as directed once a week. Replaces: Semaglutide(0.25 or 0.5MG /DOS) 2 MG/3ML Sopn Started by: Johnney Ou Raife Lizer   simvastatin 20 MG tablet Commonly known as: ZOCOR Take 1 tablet (20 mg total) by mouth at bedtime.   [name] FlexTouch 100 UNIT/ML FlexTouch Pen Generic drug: insulin degludec Inject 30 Units into the skin daily.   Trospium Chloride 60 MG Cp24 Take 1 capsule (60 mg total) by mouth daily.         OBJECTIVE:   Vital Signs: BP 128/86 (BP Location: Left Arm, Patient Position: Sitting, Cuff Size: Small)   Pulse 60   Ht 5' 3.6" (1.615 m)   Wt 181 lb (82.1 kg)   LMP  (LMP Unknown)   SpO2 99%   BMI 31.46 kg/m   Wt Readings from Last 3 Encounters:  06/08/23 181 lb (82.1 kg)  03/02/23 185 lb 0.6 oz (83.9 kg)  02/27/23 183 lb 12.8 oz (83.4 kg)     Exam: General: Pt appears well and is in NAD  Lungs: Clear with good BS bilat   Heart: RRR  Extremities: No pretibial edema.   Neuro: MS is good with appropriate affect, pt is alert and Ox3    DM foot exam: 02/27/2023  The skin of  the feet is intact without sores or ulcerations. The pedal pulses are 2+ on right and 2+ on left. The sensation is intact to a screening 5.07, 10 gram monofilament bilaterally    DATA REVIEWED:  Lab Results  Component Value Date   HGBA1C 7.8 (A) 06/08/2023   HGBA1C 9.8 (H) 02/17/2023   HGBA1C 11.1 (A) 06/06/2022    Latest Reference Range & Units 03/02/23 15:00  Sodium 135 - 145 mmol/L 135  Potassium  3.5 - 5.1 mmol/L 4.3  Chloride 98 - 111 mmol/L 102  CO2 22 - 32 mmol/L 29  Glucose 70 - 99 mg/dL 865 (H)  BUN 8 - 23 mg/dL 15  Creatinine 7.84 - 6.96 mg/dL 2.95  Calcium 8.9 - 28.4 mg/dL 9.6  Anion gap 5 - 15  4 (L)  Alkaline Phosphatase 38 - 126 U/L 56  Albumin 3.5 - 5.0 g/dL 4.3  AST 15 - 41 U/L 10 (L)  ALT 0 - 44 U/L 13  Total Protein 6.5 - 8.1 g/dL 7.5  Total Bilirubin 0.3 - 1.2 mg/dL 0.4  GFR, Est Non African American >60 mL/min >60    Latest Reference Range & Units 02/17/23 13:39  Total CHOL/HDL Ratio  4  Cholesterol 0 - 200 mg/dL 132  HDL Cholesterol >44.01 mg/dL 02.72 (L)  LDL (calc) 0 - 99 mg/dL 70  NonHDL  53.66  Triglycerides 0.0 - 149.0 mg/dL 440.3  VLDL 0.0 - 47.4 mg/dL 25.9  (L): Data is abnormally low     ASSESSMENT / PLAN / RECOMMENDATIONS:   1) Type 2 Diabetes Mellitus, Sub-Optimally controlled, Without complications - Most recent A1c of 7.8 %. Goal A1c < 7.0 %.    -I have praised the patient on improved glycemic control, she is tolerating Ozempic, I will increase the dose as below -I have warned the patient that hypoglycemia is due to insulin, and the correct action is to reduce insulin rather than discontinuing all medications -She has self discontinued metformin due to skepticism about side effects, and does not wish to go back on it -Will decrease insulin to prevent hypoglycemia -She was provided with patient assistance for Ozempic as the cost is over $200  MEDICATIONS: Increase Ozempic 1 mg weekly Continue Farxiga 10 mg daily Decrease Tresiba  24 units daily    EDUCATION / INSTRUCTIONS: BG monitoring instructions: Patient is instructed to check her blood sugars 1 times a day, fasting  Call [facility] if: BG persistently < 70  I reviewed the Rule of 15 for the treatment of hypoglycemia in detail with the patient. Literature supplied.   2) Diabetic complications:  Eye: Does not have known diabetic retinopathy.  Neuro/ Feet: Does not have known diabetic peripheral neuropathy .  Renal: Patient does not have known baseline CKD. She   is  on an ACEI/ARB at present.   F/U in 6 months   I spent 25 minutes preparing to see the patient by review of recent labs, imaging and procedures, obtaining and reviewing separately obtained history, communicating with the patient, ordering medications, tests or procedures, and documenting clinical information in the EHR including the differential Dx, treatment, and any further evaluation and other management    Signed electronically by: Lyndle Herrlich, MD  [facility]  [facility] [address] [place] [place], [place] [address] Phone: [phone] FAX: [phone]   CC: Donato Schultz, DO [address] [place] [address] Phone: [phone]  Fax: [phone]  Return to Endocrinology clinic as below: Future Appointments  Date Time Provider Department Center  06/26/2023  1:40 PM Marguerita Beards, MD [facility]  08/25/2023 11:20 AM Donato Schultz, DO [id number]

## 2023-06-08 NOTE — Patient Instructions (Addendum)
-   Increase  Ozempic 1 mg weekly  - Continue  Farxiga 10 mg, 1 tablet daily  - Decrease Tresiba 24 units daily    HOW TO TREAT LOW BLOOD SUGARS (Blood sugar LESS THAN 70 MG/DL) Please follow the RULE OF 15 for the treatment of hypoglycemia treatment (when your (blood sugars are less than 70 mg/dL)   STEP 1: Take 15 grams of carbohydrates when your blood sugar is low, which includes:  3-4 GLUCOSE TABS  OR 3-4 OZ OF JUICE OR REGULAR SODA OR ONE TUBE OF GLUCOSE GEL    STEP 2: RECHECK blood sugar in 15 MINUTES STEP 3: If your blood sugar is still low at the 15 minute recheck --> then, go back to STEP 1 and treat AGAIN with another 15 grams of carbohydrates.

## 2023-06-23 ENCOUNTER — Ambulatory Visit: Payer: Medicare HMO | Admitting: Obstetrics and Gynecology

## 2023-06-25 DIAGNOSIS — E1165 Type 2 diabetes mellitus with hyperglycemia: Secondary | ICD-10-CM | POA: Diagnosis not present

## 2023-06-26 ENCOUNTER — Ambulatory Visit: Payer: Medicare HMO | Admitting: Obstetrics and Gynecology

## 2023-06-29 ENCOUNTER — Other Ambulatory Visit: Payer: Self-pay | Admitting: Family Medicine

## 2023-06-29 DIAGNOSIS — I10 Essential (primary) hypertension: Secondary | ICD-10-CM

## 2023-07-25 DIAGNOSIS — E1165 Type 2 diabetes mellitus with hyperglycemia: Secondary | ICD-10-CM | POA: Diagnosis not present

## 2023-08-03 ENCOUNTER — Other Ambulatory Visit: Payer: Self-pay | Admitting: Family Medicine

## 2023-08-03 DIAGNOSIS — E785 Hyperlipidemia, unspecified: Secondary | ICD-10-CM

## 2023-08-03 DIAGNOSIS — I10 Essential (primary) hypertension: Secondary | ICD-10-CM

## 2023-08-04 ENCOUNTER — Other Ambulatory Visit: Payer: Self-pay | Admitting: Family Medicine

## 2023-08-04 DIAGNOSIS — I10 Essential (primary) hypertension: Secondary | ICD-10-CM

## 2023-08-04 DIAGNOSIS — E1169 Type 2 diabetes mellitus with other specified complication: Secondary | ICD-10-CM

## 2023-08-12 ENCOUNTER — Ambulatory Visit: Payer: Medicare HMO | Admitting: Internal Medicine

## 2023-08-25 ENCOUNTER — Ambulatory Visit: Payer: Medicare HMO | Admitting: Family Medicine

## 2023-08-25 DIAGNOSIS — E1165 Type 2 diabetes mellitus with hyperglycemia: Secondary | ICD-10-CM | POA: Diagnosis not present

## 2023-08-31 ENCOUNTER — Encounter: Payer: Self-pay | Admitting: Family Medicine

## 2023-08-31 ENCOUNTER — Ambulatory Visit (INDEPENDENT_AMBULATORY_CARE_PROVIDER_SITE_OTHER): Payer: Medicare HMO | Admitting: Family Medicine

## 2023-08-31 VITALS — BP 120/88 | HR 88 | Temp 98.8°F | Resp 18 | Ht 63.6 in | Wt 177.0 lb

## 2023-08-31 DIAGNOSIS — E1169 Type 2 diabetes mellitus with other specified complication: Secondary | ICD-10-CM | POA: Diagnosis not present

## 2023-08-31 DIAGNOSIS — I10 Essential (primary) hypertension: Secondary | ICD-10-CM | POA: Diagnosis not present

## 2023-08-31 DIAGNOSIS — Z1211 Encounter for screening for malignant neoplasm of colon: Secondary | ICD-10-CM

## 2023-08-31 DIAGNOSIS — Z7985 Long-term (current) use of injectable non-insulin antidiabetic drugs: Secondary | ICD-10-CM | POA: Diagnosis not present

## 2023-08-31 DIAGNOSIS — Z7984 Long term (current) use of oral hypoglycemic drugs: Secondary | ICD-10-CM | POA: Diagnosis not present

## 2023-08-31 NOTE — Patient Instructions (Signed)

## 2023-08-31 NOTE — Progress Notes (Signed)
Established Patient Office Visit  Subjective   Patient ID: Jordan Joseph, female    DOB: 03-25-1956  Age: 67 y.o. MRN: [id number]  Chief Complaint  Patient presents with   Hypertension   Hyperlipidemia   Follow-up    HPI Discussed the use of AI scribe software for clinical note transcription with the patient, who gave verbal consent to proceed.  History of Present Illness   The patient, with a history of diabetes, presented with a sudden onset of gastrointestinal symptoms that occurred the previous day. She reported feeling unwell while at the pulpit, followed by an episode of vomiting and diarrhea. The episode lasted for approximately ten minutes and was not preceded by any warning signs. The patient questioned if this could be related to a viral infection, as she felt completely recovered the following day. She denied any fever at the time of the episode.  The patient also mentioned a recent trip to [facility] for Thanksgiving, during which she felt well. She reported no changes in her medication regimen, which includes treatment for diabetes. She also mentioned that she has been monitoring her blood sugar levels with a Dexcom device, which has helped her understand her body's response to different foods and manage her diabetes more effectively.  The patient also reported that she is due for a colonoscopy in January and a visit to the OBGYN, as her previous doctor has left the practice. She also mentioned the need to visit an eye doctor, as she has not done so recently. She reported no changes in urinary or bowel habits, no changes in weight, and no other significant symptoms.      Patient Active Problem List   Diagnosis Date Noted   Long term (current) use of oral hypoglycemic drugs 06/08/2023   Long-term (current) use of injectable non-insulin antidiabetic drugs 06/08/2023   Preventative health care 10/18/2021   Hyperlipidemia associated with type 2 diabetes mellitus (HCC) 04/10/2020    Hypertension 02/17/2020   Uncontrolled type 2 diabetes mellitus with hyperglycemia (HCC) 02/17/2020   Equinus deformity of foot, acquired 05/01/2014   Past Medical History:  Diagnosis Date   Diabetes mellitus type II, uncontrolled    HTN (hypertension)    Past Surgical History:  Procedure Laterality Date   CHOLECYSTECTOMY     COLONOSCOPY  10/27/2013   [name], MD. left sided diverticulosis. O/W normar screening colonoscopy.   Suburethral sling     For incontinence- 2000   Social History   Tobacco Use   Smoking status: Never   Smokeless tobacco: Never  Vaping Use   Vaping status: Never Used  Substance Use Topics   Alcohol use: Never   Drug use: Never   Social History   Socioeconomic History   Marital status: Divorced    Spouse name: Not on file   Number of children: 2   Years of education: Not on file   Highest education level: Not on file  Occupational History   Occupation: Engineer, site  Tobacco Use   Smoking status: Never   Smokeless tobacco: Never  Vaping Use   Vaping status: Never Used  Substance and Sexual Activity   Alcohol use: Never   Drug use: Never   Sexual activity: Not Currently  Other Topics Concern   Not on file  Social History Narrative   Exercise -- walking 5000 steps    15 min trampoline   15 min gazelle    Social Determinants of Health   Financial Resource Strain: Low Risk  (  11/03/2022)   Overall Financial Resource Strain (CARDIA)    Difficulty of Paying Living Expenses: Not hard at all  Food Insecurity: No Food Insecurity (11/03/2022)   Hunger Vital Sign    Worried About Running Out of Food in the Last Year: Never true    Ran Out of Food in the Last Year: Never true  Transportation Needs: No Transportation Needs (11/03/2022)   PRAPARE - Administrator, Civil Service (Medical): No    Lack of Transportation (Non-Medical): No  Physical Activity: Insufficiently Active (11/03/2022)   Exercise Vital Sign    Days of  Exercise per Week: 3 days    Minutes of Exercise per Session: 30 min  Stress: No Stress Concern Present (11/03/2022)   [employer] of Occupational Health - Occupational Stress Questionnaire    Feeling of Stress : Not at all  Social Connections: Moderately Integrated (11/03/2022)   Social Connection and Isolation Panel [NHANES]    Frequency of Communication with Friends and Family: More than three times a week    Frequency of Social Gatherings with Friends and Family: More than three times a week    Attends Religious Services: More than 4 times per year    Active Member of [employer] or Organizations: Yes    Attends Engineer, structural: More than 4 times per year    Marital Status: Divorced  Intimate Partner Violence: Not At Risk (11/03/2022)   Humiliation, Afraid, Rape, and Kick questionnaire    Fear of Current or Ex-Partner: No    Emotionally Abused: No    Physically Abused: No    Sexually Abused: No   Family Status  Relation Name Status   Mother  Deceased at age 80   Father  Deceased at age 82   Brother  Alive   MGM  Deceased   MGF  Deceased at age 7   PGM  Deceased   PGF  Deceased   Neg Hx  (Not Specified)  No partnership data on file   Family History  Problem Relation Age of Onset   Alzheimer's disease Mother    Hypertension Father    Cirrhosis Father    Diabetes Mellitus II Father    Diabetes Brother    Hypertension Brother    Pulmonary embolism Brother    Stroke Brother    Colon cancer Neg Hx    Rectal cancer Neg Hx    Pancreatic cancer Neg Hx    Stomach cancer Neg Hx    Esophageal cancer Neg Hx    Liver cancer Neg Hx    No Known Allergies    Review of Systems  Constitutional:  Negative for fever and malaise/fatigue.  HENT:  Negative for congestion.   Eyes:  Negative for blurred vision.  Respiratory:  Negative for shortness of breath.   Cardiovascular:  Negative for chest pain, palpitations and leg swelling.  Gastrointestinal:  Negative for  abdominal pain, blood in stool and nausea.  Genitourinary:  Negative for dysuria and frequency.  Musculoskeletal:  Negative for falls.  Skin:  Negative for rash.  Neurological:  Negative for dizziness, loss of consciousness and headaches.  Endo/Heme/Allergies:  Negative for environmental allergies.  Psychiatric/Behavioral:  Negative for depression. The patient is not nervous/anxious.       Objective:     BP 120/88 (BP Location: Left Arm, Patient Position: Sitting, Cuff Size: Normal)   Pulse 88   Temp 98.8 F (37.1 C) (Oral)   Resp 18   Ht 5' 3.6" (  1.615 m)   Wt 177 lb (80.3 kg)   LMP  (LMP Unknown)   SpO2 98%   BMI 30.77 kg/m  BP Readings from Last 3 Encounters:  08/31/23 120/88  06/08/23 128/86  03/02/23 (!) 183/85   Wt Readings from Last 3 Encounters:  08/31/23 177 lb (80.3 kg)  06/08/23 181 lb (82.1 kg)  03/02/23 185 lb 0.6 oz (83.9 kg)   SpO2 Readings from Last 3 Encounters:  08/31/23 98%  06/08/23 99%  03/02/23 100%      Physical Exam Vitals and nursing note reviewed.  Constitutional:      General: She is not in acute distress.    Appearance: Normal appearance. She is well-developed.  HENT:     Head: Normocephalic and atraumatic.  Eyes:     General: No scleral icterus.       Right eye: No discharge.        Left eye: No discharge.  Cardiovascular:     Rate and Rhythm: Normal rate and regular rhythm.     Heart sounds: No murmur heard. Pulmonary:     Effort: Pulmonary effort is normal. No respiratory distress.     Breath sounds: Normal breath sounds.  Musculoskeletal:        General: Normal range of motion.     Cervical back: Normal range of motion and neck supple.     Right lower leg: No edema.     Left lower leg: No edema.  Skin:    General: Skin is warm and dry.  Neurological:     Mental Status: She is alert and oriented to person, place, and time.  Psychiatric:        Mood and Affect: Mood normal.        Behavior: Behavior normal.         Thought Content: Thought content normal.        Judgment: Judgment normal.      No results found for any visits on 08/31/23.  Last CBC Lab Results  Component Value Date   WBC 6.1 03/02/2023   HGB 11.6 (L) 03/02/2023   HCT 35.3 (L) 03/02/2023   MCV 94.9 03/02/2023   MCH 31.2 03/02/2023   RDW 12.6 03/02/2023   PLT 273 03/02/2023   Last metabolic panel Lab Results  Component Value Date   GLUCOSE 365 (H) 03/02/2023   NA 135 03/02/2023   K 4.3 03/02/2023   CL 102 03/02/2023   CO2 29 03/02/2023   BUN 15 03/02/2023   CREATININE 0.81 03/02/2023   GFRNONAA >60 03/02/2023   CALCIUM 9.6 03/02/2023   PROT 7.5 03/02/2023   ALBUMIN 4.3 03/02/2023   BILITOT 0.4 03/02/2023   ALKPHOS 56 03/02/2023   AST 10 (L) 03/02/2023   ALT 13 03/02/2023   ANIONGAP 4 (L) 03/02/2023   Last lipids Lab Results  Component Value Date   CHOL 133 02/17/2023   HDL 36.10 (L) 02/17/2023   LDLCALC 70 02/17/2023   LDLDIRECT 84.0 05/05/2022   TRIG 130.0 02/17/2023   CHOLHDL 4 02/17/2023   Last hemoglobin A1c Lab Results  Component Value Date   HGBA1C 7.8 (A) 06/08/2023   Last thyroid functions Lab Results  Component Value Date   TSH 1.80 02/17/2023   Last vitamin D Lab Results  Component Value Date   VD25OH 29.56 (L) 02/17/2023   Last vitamin B12 and Folate Lab Results  Component Value Date   VITAMINB12 >1500 (H) 02/17/2023      The 10-year ASCVD risk  score (Arnett DK, et al., 2019) is: 14.2%    Assessment & Plan:   Problem List Items Addressed This Visit       Unprioritized   Hypertension - Primary   Relevant Orders   CBC with Differential/Platelet   Comprehensive metabolic panel   Lipid panel   TSH   CBC with Differential/Platelet   Comprehensive metabolic panel   Lipid panel   TSH   Other Visit Diagnoses     Type 2 diabetes mellitus with other specified complication, without long-term current use of insulin (HCC)       Relevant Orders   CBC with  Differential/Platelet   Comprehensive metabolic panel   Lipid panel   TSH   Microalbumin / creatinine urine ratio   Colon cancer screening       Relevant Orders   Ambulatory referral to Gastroenterology     Assessment and Plan    Acute Gastroenteritis   She experienced a sudden onset of vomiting and diarrhea yesterday, lasting about ten minutes, and had a fever, but is asymptomatic today. The likely viral etiology, given the acute and self-limiting nature, was discussed. We advised hydration and rest and instructed her to contact the clinic if symptoms recur.  Diabetes Mellitus   She is using a Dexcom device for glucose monitoring, reports improved control, and has made dietary adjustments. She is due for a microalbumin test to monitor kidney function. We discussed the benefits of continuous glucose monitoring and dietary changes. We will order a microalbumin test, continue the current diabetes management plan, and follow up with a diabetic specialist in March.  Hypertension   Her blood pressure was well-managed during the last diabetic appointment two months ago. We emphasized regular monitoring and adherence to the antihypertensive regimen. We will continue the current antihypertensive regimen and monitor blood pressure regularly.  General Health Maintenance   She is overdue for a pneumonia vaccination and colonoscopy and needs to schedule an eye exam. We discussed the importance of these preventive measures, especially in the context of diabetes. We will administer the pneumonia vaccination once fully recovered from the recent illness, schedule a colonoscopy for January, and refer her to an eye doctor for a comprehensive eye exam, including diabetic retinopathy screening.  Follow-up   We will follow up with the diabetic specialist in March, ensure the eye exam is scheduled, and ensure the colonoscopy is scheduled for January.        Return in about 6 months (around 02/29/2024), or if  symptoms worsen or fail to improve, for annual exam, fasting.    Donato Schultz, DO

## 2023-09-01 LAB — MICROALBUMIN / CREATININE URINE RATIO
Creatinine,U: 88.1 mg/dL
Microalb Creat Ratio: 65.9 mg/g — ABNORMAL HIGH (ref 0.0–30.0)
Microalb, Ur: 58.1 mg/dL — ABNORMAL HIGH (ref 0.0–1.9)

## 2023-09-01 LAB — CBC WITH DIFFERENTIAL/PLATELET
Basophils Absolute: 0.1 10*3/uL (ref 0.0–0.1)
Basophils Relative: 0.6 % (ref 0.0–3.0)
Eosinophils Absolute: 0.1 10*3/uL (ref 0.0–0.7)
Eosinophils Relative: 1.7 % (ref 0.0–5.0)
HCT: 38.6 % (ref 36.0–46.0)
Hemoglobin: 12.6 g/dL (ref 12.0–15.0)
Lymphocytes Relative: 20.5 % (ref 12.0–46.0)
Lymphs Abs: 1.7 10*3/uL (ref 0.7–4.0)
MCHC: 32.7 g/dL (ref 30.0–36.0)
MCV: 95.8 fL (ref 78.0–100.0)
Monocytes Absolute: 0.5 10*3/uL (ref 0.1–1.0)
Monocytes Relative: 5.7 % (ref 3.0–12.0)
Neutro Abs: 5.9 10*3/uL (ref 1.4–7.7)
Neutrophils Relative %: 71.5 % (ref 43.0–77.0)
Platelets: 301 10*3/uL (ref 150.0–400.0)
RBC: 4.03 Mil/uL (ref 3.87–5.11)
RDW: 13.6 % (ref 11.5–15.5)
WBC: 8.2 10*3/uL (ref 4.0–10.5)

## 2023-09-01 LAB — COMPREHENSIVE METABOLIC PANEL
ALT: 16 U/L (ref 0–35)
AST: 11 U/L (ref 0–37)
Albumin: 4 g/dL (ref 3.5–5.2)
Alkaline Phosphatase: 71 U/L (ref 39–117)
BUN: 14 mg/dL (ref 6–23)
CO2: 29 meq/L (ref 19–32)
Calcium: 9.9 mg/dL (ref 8.4–10.5)
Chloride: 101 meq/L (ref 96–112)
Creatinine, Ser: 0.93 mg/dL (ref 0.40–1.20)
GFR: 63.93 mL/min (ref >60.00)
Glucose, Bld: 193 mg/dL — ABNORMAL HIGH (ref 70–99)
Potassium: 3.9 meq/L (ref 3.5–5.1)
Sodium: 138 meq/L (ref 135–145)
Total Bilirubin: 0.5 mg/dL (ref 0.2–1.2)
Total Protein: 7 g/dL (ref 6.0–8.3)

## 2023-09-01 LAB — TSH: TSH: 1.31 u[IU]/mL (ref 0.35–5.50)

## 2023-09-01 LAB — LIPID PANEL
Cholesterol: 186 mg/dL (ref 0–200)
HDL: 31.2 mg/dL — ABNORMAL LOW (ref >39.00)
LDL Cholesterol: 107 mg/dL — ABNORMAL HIGH (ref 0–99)
NonHDL: 155.15
Total CHOL/HDL Ratio: 6
Triglycerides: 241 mg/dL — ABNORMAL HIGH (ref 0.0–149.0)
VLDL: 48.2 mg/dL — ABNORMAL HIGH (ref 0.0–40.0)

## 2023-09-02 ENCOUNTER — Other Ambulatory Visit: Payer: Self-pay | Admitting: Family Medicine

## 2023-09-02 DIAGNOSIS — E785 Hyperlipidemia, unspecified: Secondary | ICD-10-CM

## 2023-09-02 DIAGNOSIS — I10 Essential (primary) hypertension: Secondary | ICD-10-CM

## 2023-09-02 DIAGNOSIS — E1169 Type 2 diabetes mellitus with other specified complication: Secondary | ICD-10-CM

## 2023-09-11 ENCOUNTER — Other Ambulatory Visit: Payer: Self-pay | Admitting: Internal Medicine

## 2023-09-11 DIAGNOSIS — E1165 Type 2 diabetes mellitus with hyperglycemia: Secondary | ICD-10-CM

## 2023-09-16 ENCOUNTER — Other Ambulatory Visit: Payer: Self-pay

## 2023-09-16 MED ORDER — SIMVASTATIN 40 MG PO TABS: 40.0000 mg | ORAL_TABLET | Freq: Every day | ORAL | 2 refills | Status: DC

## 2023-09-24 DIAGNOSIS — E1165 Type 2 diabetes mellitus with hyperglycemia: Secondary | ICD-10-CM | POA: Diagnosis not present

## 2023-10-21 ENCOUNTER — Telehealth: Payer: Self-pay | Admitting: Family Medicine

## 2023-10-21 NOTE — Telephone Encounter (Signed)
Copied from CRM [id number]. Topic: Medicare AWV >> Oct 21, 2023  1:52 PM Payton Doughty wrote: Reason for CRM: Called LVM 10/21/2023 to schedule AWV. Please schedule Virtual or Telehealth visits ONLY.   Verlee Rossetti; Care Guide Ambulatory Clinical Support [place] [facility] Direct Dial: [phone]

## 2023-10-24 DIAGNOSIS — E1165 Type 2 diabetes mellitus with hyperglycemia: Secondary | ICD-10-CM | POA: Diagnosis not present

## 2023-12-02 ENCOUNTER — Other Ambulatory Visit: Payer: Self-pay | Admitting: Family Medicine

## 2023-12-02 DIAGNOSIS — Z1231 Encounter for screening mammogram for malignant neoplasm of breast: Secondary | ICD-10-CM

## 2023-12-07 ENCOUNTER — Encounter: Payer: Self-pay | Admitting: Internal Medicine

## 2023-12-07 ENCOUNTER — Ambulatory Visit: Payer: Medicare HMO | Admitting: Internal Medicine

## 2023-12-07 VITALS — BP 124/84 | HR 90 | Resp 20 | Ht 63.6 in | Wt 180.8 lb

## 2023-12-07 DIAGNOSIS — Z7984 Long term (current) use of oral hypoglycemic drugs: Secondary | ICD-10-CM

## 2023-12-07 DIAGNOSIS — E1165 Type 2 diabetes mellitus with hyperglycemia: Secondary | ICD-10-CM

## 2023-12-07 DIAGNOSIS — Z794 Long term (current) use of insulin: Secondary | ICD-10-CM

## 2023-12-07 LAB — POCT GLYCOSYLATED HEMOGLOBIN (HGB A1C): Hemoglobin A1C: 7.5 % — AB (ref 4.0–5.6)

## 2023-12-07 MED ORDER — INSULIN DEGLUDEC FLEXTOUCH 100 UNIT/ML ~~LOC~~ SOPN: 30.0000 [IU] | PEN_INJECTOR | Freq: Every day | SUBCUTANEOUS | 3 refills | Status: DC

## 2023-12-07 MED ORDER — BD PEN NEEDLE MICRO U/F 32G X 6 MM MISC: 1.0000 | Freq: Every day | 3 refills | Status: DC

## 2023-12-07 NOTE — Patient Instructions (Addendum)
-   Continue  Farxiga 10 mg, 1 tablet daily  - Increase Tresiba  30 units daily  - Please let me know if you would like to switch Ozempic to [facility] to help eventually with weight loss or switch to Glipizide as it has a better cost .    HOW TO TREAT LOW BLOOD SUGARS (Blood sugar LESS THAN 70 MG/DL) Please follow the RULE OF 15 for the treatment of hypoglycemia treatment (when your (blood sugars are less than 70 mg/dL)   STEP 1: Take 15 grams of carbohydrates when your blood sugar is low, which includes:  3-4 GLUCOSE TABS  OR 3-4 OZ OF JUICE OR REGULAR SODA OR ONE TUBE OF GLUCOSE GEL    STEP 2: RECHECK blood sugar in 15 MINUTES STEP 3: If your blood sugar is still low at the 15 minute recheck --> then, go back to STEP 1 and treat AGAIN with another 15 grams of carbohydrates.

## 2023-12-07 NOTE — Progress Notes (Signed)
 Name: Jordan Joseph  Age/ Sex: 68 y.o., female   MRN/ DOB: [id number], 12/24/1955     PCP: Donato Schultz, DO   Reason for Endocrinology Evaluation: Type 2 Diabetes Mellitus  Initial Endocrine Consultative Visit: 03/06/2020    PATIENT IDENTIFIER: Jordan Joseph is a 68 y.o. female with a past medical history of HTN and T2DM. The patient has followed with Endocrinology clinic since 03/06/2020 for consultative assistance with management of her diabetes.  DIABETIC HISTORY:  Jordan Joseph was diagnosed with T2DM in 2013. Marland Kitchen Her hemoglobin A1c has ranged from 11.1% in 04/2020, peaking at 13.5 % in 2021  ON her initial visit she was on basal insulin and Glipizide. We stopped Glipizide, started Metformin and increased basal insulin  She was last to follow-up for a year until her return in 05/2022 with an A1c of 11.1%.  She was not taking metformin due to skepticism about side effects so I take it off her list, we continue [place] and [place]  She met with our CDE for Dexcom G7 training 09/2022   Started Ozempic 01/2023  SUBJECTIVE:   During the last visit (06/08/2023): A1c 7.8%    Today (12/07/2023): Ms. Jenison is here for a follow up on diabetes management.   She checks her blood sugars multiple times daily through CGM, no hypoglycemia   Patient self discontinued metformin due to reading about it online  She has noted bloating over the past month with fecal incontinence  Denies nausea , vomiting  She is disappointment due to lack of weight loss  The cost for Ozempic and Marcelline Deist is almost $400 , pt will not be able to afford this long term    HOME DIABETES REGIMEN:  Farxiga 10 mg daily Ozempic 1 mg weekly Tresiba 26  units daily       Statin: Yes ACE-I/ARB: Yes     CONTINUOUS GLUCOSE MONITORING RECORD INTERPRETATION    Dates of Recording: 2/25-3/06/2024  Sensor description:dexcom  Results statistics:   CGM use % of time 95  Average and SD 221/45  Time in range  22 %   % Time Above 180 51  % Time above 250 27  % Time Below target 0   Glycemic patterns summary: BG's are high through the day and night  Hyperglycemic episodes  postprandial  Hypoglycemic episodes occurred n/a  Overnight periods: high    DIABETIC COMPLICATIONS: Microvascular complications:   Denies: CKD, retinopathy, neuropathy  Last Eye Exam: Completed 2020  Macrovascular complications:   Denies: CAD, CVA, PVD   HISTORY:  Past Medical History:  Past Medical History:  Diagnosis Date   Diabetes mellitus type II, uncontrolled    HTN (hypertension)    Past Surgical History:  Past Surgical History:  Procedure Laterality Date   CHOLECYSTECTOMY     COLONOSCOPY  10/27/2013   Jordan Running. [name], MD. left sided diverticulosis. O/W normar screening colonoscopy.   Suburethral sling     For incontinence- 2000   Social History:  reports that she has never smoked. She has never used smokeless tobacco. She reports that she does not drink alcohol and does not use drugs. Family History:  Family History  Problem Relation Age of Onset   Alzheimer's disease Mother    Hypertension Father    Cirrhosis Father    Diabetes Mellitus II Father    Diabetes Brother    Hypertension Brother    Pulmonary embolism Brother    Stroke Brother    Colon cancer Neg Hx  Rectal cancer Neg Hx    Pancreatic cancer Neg Hx    Stomach cancer Neg Hx    Esophageal cancer Neg Hx    Liver cancer Neg Hx      HOME MEDICATIONS: Allergies as of 12/07/2023   No Known Allergies      Medication List        Accurate as of December 07, 2023  3:01 PM. If you have any questions, ask your nurse or doctor.          amLODipine 10 MG tablet Commonly known as: NORVASC Take 1 tablet (10 mg total) by mouth daily.   BD Pen Needle Micro U/F 32G X 6 MM Misc Generic drug: Insulin Pen Needle 1 Device by Does not apply route daily in the afternoon.   carvedilol 25 MG tablet Commonly known as: COREG Take 1  tablet (25 mg total) by mouth 2 (two) times daily with a meal.   cloNIDine 0.1 MG tablet Commonly known as: CATAPRES Take 1 tablet (0.1 mg total) by mouth 2 (two) times daily.   dapagliflozin propanediol 10 MG Tabs tablet Commonly known as: Farxiga Take 1 tablet (10 mg total) by mouth daily before breakfast.   Dexcom G7 Sensor Misc Change sensor every 10 days   FREESTYLE LITE test strip Generic drug: glucose blood 1 each by Other route daily in the afternoon. Use as instructed   Insulin Degludec FlexTouch 100 UNIT/ML Sopn INJECT 30 UNITS SUBCUTANEOUSLY ONCE DAILY   losartan-hydrochlorothiazide 100-25 MG tablet Commonly known as: HYZAAR Take 1 tablet by mouth daily.   Semaglutide (1 MG/DOSE) 4 MG/3ML Sopn Inject 1 mg as directed once a week.   simvastatin 40 MG tablet Commonly known as: ZOCOR Take 1 tablet (40 mg total) by mouth at bedtime.   Trospium Chloride 60 MG Cp24 Take 1 capsule (60 mg total) by mouth daily.         OBJECTIVE:   Vital Signs: BP 124/84 (BP Location: Left Arm, Patient Position: Sitting, Cuff Size: Normal)   Pulse 90   Resp 20   Ht 5' 3.6" (1.615 m)   Wt 180 lb 12.8 oz (82 kg)   LMP  (LMP Unknown)   SpO2 98%   BMI 31.43 kg/m   Wt Readings from Last 3 Encounters:  12/07/23 180 lb 12.8 oz (82 kg)  08/31/23 177 lb (80.3 kg)  06/08/23 181 lb (82.1 kg)     Exam: General: Pt appears well and is in NAD  Lungs: Clear with good BS bilat   Heart: RRR  Extremities: No pretibial edema.   Neuro: MS is good with appropriate affect, pt is alert and Ox3    DM foot exam: 12/07/2023  The skin of the feet is intact without sores or ulcerations. The pedal pulses are 2+ on right and 2+ on left. The sensation is intact to a screening 5.07, 10 gram monofilament bilaterally    DATA REVIEWED:  Lab Results  Component Value Date   HGBA1C 7.5 (A) 12/07/2023   HGBA1C 7.8 (A) 06/08/2023   HGBA1C 9.8 (H) 02/17/2023     Latest Reference Range &  Units 08/31/23 14:17  Sodium 135 - 145 mEq/L 138  Potassium 3.5 - 5.1 mEq/L 3.9  Chloride 96 - 112 mEq/L 101  CO2 19 - 32 mEq/L 29  Glucose 70 - 99 mg/dL 161 (H)  BUN 6 - 23 mg/dL 14  Creatinine 0.96 - 0.45 mg/dL 4.09  Calcium 8.4 - 81.1 mg/dL 9.9  Alkaline Phosphatase  39 - 117 U/L 71  Albumin 3.5 - 5.2 g/dL 4.0  AST 0 - 37 U/L 11  ALT 0 - 35 U/L 16  Total Protein 6.0 - 8.3 g/dL 7.0  Total Bilirubin 0.2 - 1.2 mg/dL 0.5  GFR >16.10 mL/min 63.93    Latest Reference Range & Units 08/31/23 14:17  Total CHOL/HDL Ratio  6  Cholesterol 0 - 200 mg/dL 960  HDL Cholesterol >45.40 mg/dL 98.11 (L)  LDL (calc) 0 - 99 mg/dL 914 (H)  MICROALB/CREAT RATIO 0.0 - 30.0 mg/g 65.9 (H)  NonHDL  155.15  Triglycerides 0.0 - 149.0 mg/dL 782.9 (H)  VLDL 0.0 - 56.2 mg/dL 13.0 (H)  (L): Data is abnormally low (H): Data is abnormally high   ASSESSMENT / PLAN / RECOMMENDATIONS:   1) Type 2 Diabetes Mellitus, Sub-Optimally controlled, Without complications - Most recent A1c of 7.5 %. Goal A1c < 7.0 %.    -She has self discontinued metformin due to skepticism about side effects, and does not wish to go back on it -She is disappointed in Ozempic due to lack of weight loss, she is also complaining about the cost, she was provided with patient assistance forms in the past but these were not brought back to the office.  She believes she did better with the 0.5 mg dose, patient does not wish to increase Ozempic, as she feels that her body is rejecting it. -She was given 2 options to replace Ozempic, either switch to [facility] with similar price range but with better benefits down the road as far as weight loss and A1c reduction, versus switching to glipizide to be taken twice daily with no weight loss benefits but the cost will be affordable.  Patient has 3 weeks to think about this and let our office know, as she currently has enough Ozempic to last for the next 3 weeks.  -She was provided with patient assistance  forms for the Farxiga, we did discuss cardiovascular and renal benefits -I have increased [place] as below  MEDICATIONS: Ozempic 1 mg weekly Continue Farxiga 10 mg daily Increase Tresiba 30 units daily    EDUCATION / INSTRUCTIONS: BG monitoring instructions: Patient is instructed to check her blood sugars 1 times a day, fasting  Call [facility] if: BG persistently < 70  I reviewed the Rule of 15 for the treatment of hypoglycemia in detail with the patient. Literature supplied.   2) Diabetic complications:  Eye: Does not have known diabetic retinopathy.  Neuro/ Feet: Does not have known diabetic peripheral neuropathy .  Renal: Patient does not have known baseline CKD. She   is  on an ACEI/ARB at present.   F/U in 4 months   I spent 30 minutes preparing to see the patient by review of recent labs, imaging and procedures, obtaining and reviewing separately obtained history, communicating with the patient, ordering medications, tests or procedures, and documenting clinical information in the EHR including the differential Dx, treatment, and any further evaluation and other management    Signed electronically by: Lyndle Herrlich, MD  [facility]  [facility] [address] [place] [place], [place] [address] Phone: [phone] FAX: [phone]   CC: Donato Schultz, DO [address] [place] [address] Phone: [phone]  Fax: [phone]  Return to Endocrinology clinic as below: Future Appointments  Date Time Provider Department Center  12/17/2023  1:45 PM [facility]  01/18/2024  1:30 PM [facility]  2 [facility]  02/29/2024  2:20 PM Lowne Irish Elders, DO [facility]

## 2023-12-08 ENCOUNTER — Encounter: Payer: Self-pay | Admitting: Internal Medicine

## 2023-12-17 ENCOUNTER — Other Ambulatory Visit: Payer: Medicare HMO

## 2023-12-23 ENCOUNTER — Encounter: Payer: Self-pay | Admitting: Gastroenterology

## 2024-01-05 ENCOUNTER — Telehealth: Payer: Self-pay | Admitting: Internal Medicine

## 2024-01-05 MED ORDER — TIRZEPATIDE 5 MG/0.5ML ~~LOC~~ SOAJ: 5.0000 mg | SUBCUTANEOUS | 3 refills | Status: DC

## 2024-01-05 NOTE — Telephone Encounter (Signed)
 Patient ready for the [facility] to be sent in to pharmacy

## 2024-01-05 NOTE — Telephone Encounter (Signed)
 Patient is  calling to say that she is ready for new prescription to be sent in and her new pharmacy is:  [facility]   Patient is wanting all of her future diabetic refills sent in to this pharmacy.

## 2024-01-06 ENCOUNTER — Telehealth: Payer: Self-pay | Admitting: Internal Medicine

## 2024-01-06 DIAGNOSIS — E1165 Type 2 diabetes mellitus with hyperglycemia: Secondary | ICD-10-CM

## 2024-01-06 NOTE — Telephone Encounter (Signed)
 MEDICATION:  1)  dapagliflozin propanediol dapagliflozin propanediol (FARXIGA) 10 MG TABS tablet  2) Insulin Degludec FlexTouch Insulin Degludec FlexTouch 100 UNIT/ML SOPN  PHARMACY:    [id number] - [place], [place] - [address] (Ph: [phone])    HAS THE PATIENT CONTACTED THEIR PHARMACY?  Yes  IS THIS A 90 DAY SUPPLY : Yes  IS PATIENT OUT OF MEDICATION: No  IF NOT; HOW MUCH IS LEFT:   LAST APPOINTMENT DATE: @ 12/07/2023  NEXT APPOINTMENT DATE:@7 /07/2024  DO WE HAVE YOUR PERMISSION TO LEAVE A DETAILED MESSAGE?: Yes  OTHER COMMENTS:    **Let patient know to contact pharmacy at the end of the day to make sure medication is ready. **  ** Please notify patient to allow 48-72 hours to process**  **Encourage patient to contact the pharmacy for refills or they can request refills through [facility]**

## 2024-01-07 MED ORDER — INSULIN DEGLUDEC FLEXTOUCH 100 UNIT/ML ~~LOC~~ SOPN: 30.0000 [IU] | PEN_INJECTOR | Freq: Every day | SUBCUTANEOUS | 3 refills | Status: DC

## 2024-01-07 MED ORDER — DAPAGLIFLOZIN PROPANEDIOL 10 MG PO TABS: 10.0000 mg | ORAL_TABLET | Freq: Every day | ORAL | 3 refills | Status: DC

## 2024-01-07 NOTE — Telephone Encounter (Signed)
 Done

## 2024-01-18 ENCOUNTER — Ambulatory Visit
Admission: RE | Admit: 2024-01-18 | Discharge: 2024-01-18 | Disposition: A | Discharge status: Home / Self Care | Source: Ambulatory Visit | Attending: Family Medicine | Admitting: Family Medicine

## 2024-01-18 DIAGNOSIS — Z1231 Encounter for screening mammogram for malignant neoplasm of breast: Secondary | ICD-10-CM

## 2024-01-20 ENCOUNTER — Other Ambulatory Visit: Payer: Self-pay | Admitting: Family Medicine

## 2024-01-20 DIAGNOSIS — I10 Essential (primary) hypertension: Secondary | ICD-10-CM

## 2024-01-20 MED ORDER — LOSARTAN POTASSIUM-HCTZ 100-25 MG PO TABS: 1.0000 | ORAL_TABLET | Freq: Every day | ORAL | 1 refills | Status: DC

## 2024-01-20 MED ORDER — AMLODIPINE BESYLATE 10 MG PO TABS: 10.0000 mg | ORAL_TABLET | Freq: Every day | ORAL | 0 refills | Status: DC

## 2024-01-20 MED ORDER — SIMVASTATIN 40 MG PO TABS: 40.0000 mg | ORAL_TABLET | Freq: Every day | ORAL | 2 refills | Status: DC

## 2024-01-20 MED ORDER — CARVEDILOL 25 MG PO TABS
25.0000 mg | ORAL_TABLET | Freq: Two times a day (BID) | ORAL | 3 refills | Status: AC
Start: 2024-01-20 — End: ?

## 2024-01-20 NOTE — Telephone Encounter (Signed)
 Copied from CRM [id number]. Topic: Clinical - Medication Refill >> Jan 20, 2024 10:44 AM Freya Jesus wrote: Most Recent Primary Care Visit:  Provider: Crecencio Dodge, Adel Holt R  Department: [facility]  Visit Type: OFFICE VISIT  Date: 08/31/2023  Medication: amLODipine  (NORVASC ) 10 MG tablet [[id number]] simvastatin  (ZOCOR ) 40 MG tablet [[id number]] carvedilol  (COREG ) 25 MG tablet [[id number]] losartan -hydrochlorothiazide (HYZAAR) 100-25 MG tablet [[id number]]   Has the patient contacted their pharmacy? Yes (Agent: If no, request that the patient contact the pharmacy for the refill. If patient does not wish to contact the pharmacy document the reason why and proceed with request.) (Agent: If yes, when and what did the pharmacy advise?) need new prescription.  Is this the correct pharmacy for this prescription? Yes If no, delete pharmacy and type the correct one.  This is the patient's preferred pharmacy:  [facility] [id number] [place] - [address] [place] [address] Phone: [phone] Fax: [phone]   Has the prescription been filled recently? No  Is the patient out of the medication? Yes  Has the patient been seen for an appointment in the last year OR does the patient have an upcoming appointment? Yes  Can we respond through MyChart? No  Agent: Please be advised that Rx refills may take up to 3 business days. We ask that you follow-up with your pharmacy.

## 2024-01-27 LAB — HM DIABETES EYE EXAM

## 2024-02-10 DIAGNOSIS — Z860100 Personal history of colon polyps, unspecified: Secondary | ICD-10-CM | POA: Diagnosis not present

## 2024-02-10 DIAGNOSIS — K6389 Other specified diseases of intestine: Secondary | ICD-10-CM | POA: Diagnosis not present

## 2024-02-10 DIAGNOSIS — Z1211 Encounter for screening for malignant neoplasm of colon: Secondary | ICD-10-CM | POA: Diagnosis not present

## 2024-02-10 DIAGNOSIS — K640 First degree hemorrhoids: Secondary | ICD-10-CM | POA: Diagnosis not present

## 2024-02-10 DIAGNOSIS — K5730 Diverticulosis of large intestine without perforation or abscess without bleeding: Secondary | ICD-10-CM | POA: Diagnosis not present

## 2024-02-10 DIAGNOSIS — Q432 Other congenital functional disorders of colon: Secondary | ICD-10-CM | POA: Diagnosis not present

## 2024-02-29 ENCOUNTER — Ambulatory Visit (INDEPENDENT_AMBULATORY_CARE_PROVIDER_SITE_OTHER): Payer: Medicare HMO | Admitting: Family Medicine

## 2024-02-29 ENCOUNTER — Encounter: Payer: Self-pay | Admitting: Family Medicine

## 2024-02-29 VITALS — HR 72 | Temp 98.1°F | Resp 16 | Ht 63.5 in | Wt 185.0 lb

## 2024-02-29 DIAGNOSIS — R21 Rash and other nonspecific skin eruption: Secondary | ICD-10-CM | POA: Diagnosis not present

## 2024-02-29 DIAGNOSIS — E1169 Type 2 diabetes mellitus with other specified complication: Secondary | ICD-10-CM

## 2024-02-29 DIAGNOSIS — I10 Essential (primary) hypertension: Secondary | ICD-10-CM

## 2024-02-29 DIAGNOSIS — Z7985 Long-term (current) use of injectable non-insulin antidiabetic drugs: Secondary | ICD-10-CM

## 2024-02-29 DIAGNOSIS — Z7984 Long term (current) use of oral hypoglycemic drugs: Secondary | ICD-10-CM | POA: Diagnosis not present

## 2024-02-29 DIAGNOSIS — E785 Hyperlipidemia, unspecified: Secondary | ICD-10-CM | POA: Diagnosis not present

## 2024-02-29 MED ORDER — PREDNISONE 10 MG PO TABS: ORAL_TABLET | ORAL | 0 refills | Status: DC

## 2024-02-29 MED ORDER — METHYLPREDNISOLONE ACETATE 40 MG/ML IJ SUSP
40.0000 mg | Freq: Once | INTRAMUSCULAR | Status: AC
  Administered 2024-02-29: 40 mg via INTRAMUSCULAR

## 2024-02-29 NOTE — Progress Notes (Signed)
 Established Patient Office Visit  Subjective   Patient ID: Jordan Joseph, female    DOB: 06-30-1956  Age: 68 y.o. MRN: [id number]  Chief Complaint  Patient presents with   Hypertension    Here for follow up   Rash    Patient complains of skin rash around neck.     HPI Discussed the use of AI scribe software for clinical note transcription with the patient, who gave verbal consent to proceed.  History of Present Illness Jordan Joseph "Palmer [name]" is a 68 year old female with diabetes who presents with a persistent rash on her neck.  The rash began a couple of months ago as a small area on her neck. Initially, she applied an over-the-counter zinc cream which resolved the rash temporarily. However, about three to three and a half weeks ago, the rash reappeared and has been spreading daily, primarily affecting the left side of her neck and extending up her chin. She describes intense pruritus and has been scratching frequently. No new lotions, perfumes, or jewelry have been used, but she did change laundry detergents. She denies any new clothing or outdoor activities, but reports having had two or three hotel stays since April.  She has a history of diabetes with significant fluctuations in blood sugar levels, reaching as high as 350 mg/dL. Her diet has been consistent with low carbohydrate intake, yet her blood sugar levels remain unpredictable. She is currently on insulin , Farxiga , and Mounjaro, which she takes once a week on Mondays. Despite her efforts to manage her diet, her blood sugar levels are not stable.  No fever, systemic reactions, or other areas of itching. She has a dog that is both indoor and outdoor, but she does not believe this is related to her symptoms.   Patient Active Problem List   Diagnosis Date Noted   Long term (current) use of oral hypoglycemic drugs 06/08/2023   Long-term (current) use of injectable non-insulin  antidiabetic drugs 06/08/2023   Preventative health  care 10/18/2021   Hyperlipidemia associated with type 2 diabetes mellitus (HCC) 04/10/2020   Hypertension 02/17/2020   Uncontrolled type 2 diabetes mellitus with hyperglycemia (HCC) 02/17/2020   Equinus deformity of foot, acquired 05/01/2014   Past Medical History:  Diagnosis Date   Diabetes mellitus type II, uncontrolled    HTN (hypertension)    Past Surgical History:  Procedure Laterality Date   CHOLECYSTECTOMY     COLONOSCOPY  10/27/2013   Abe Abed. [name], MD. left sided diverticulosis. O/W normar screening colonoscopy.   Suburethral sling     For incontinence- 2000   Social History   Tobacco Use   Smoking status: Never   Smokeless tobacco: Never  Vaping Use   Vaping status: Never Used  Substance Use Topics   Alcohol use: Never   Drug use: Never   Social History   Socioeconomic History   Marital status: Divorced    Spouse name: Not on file   Number of children: 2   Years of education: Not on file   Highest education level: Bachelor's degree (e.g., BA, AB, BS)  Occupational History   Occupation: Engineer, site  Tobacco Use   Smoking status: Never   Smokeless tobacco: Never  Vaping Use   Vaping status: Never Used  Substance and Sexual Activity   Alcohol use: Never   Drug use: Never   Sexual activity: Not Currently  Other Topics Concern   Not on file  Social History Narrative   Exercise -- walking 5000 steps  15 min trampoline   15 min gazelle    Social Drivers of [employer]: Low Risk  (02/26/2024)   Overall Financial Resource Strain (CARDIA)    Difficulty of Paying Living Expenses: Not very hard  Food Insecurity: No Food Insecurity (02/26/2024)   Hunger Vital Sign    Worried About Running Out of Food in the Last Year: Never true    Ran Out of Food in the Last Year: Never true  Transportation Needs: No Transportation Needs (02/26/2024)   PRAPARE - Administrator, Civil Service (Medical): No    Lack of  Transportation (Non-Medical): No  Physical Activity: Insufficiently Active (02/26/2024)   Exercise Vital Sign    Days of Exercise per Week: 2 days    Minutes of Exercise per Session: 60 min  Stress: Stress Concern Present (02/26/2024)   [employer] of Occupational Health - Occupational Stress Questionnaire    Feeling of Stress : To some extent  Social Connections: Moderately Integrated (02/26/2024)   Social Connection and Isolation Panel [NHANES]    Frequency of Communication with Friends and Family: More than three times a week    Frequency of Social Gatherings with Friends and Family: Once a week    Attends Religious Services: More than 4 times per year    Active Member of [employer] or Organizations: Yes    Attends Engineer, structural: More than 4 times per year    Marital Status: Divorced  Intimate Partner Violence: Not At Risk (11/03/2022)   Humiliation, Afraid, Rape, and Kick questionnaire    Fear of Current or Ex-Partner: No    Emotionally Abused: No    Physically Abused: No    Sexually Abused: No   Family Status  Relation Name Status   Mother  Deceased at age 94   Father  Deceased at age 40   Brother  Alive   MGM  Deceased   MGF  Deceased at age 48   PGM  Deceased   PGF  Deceased   Neg Hx  (Not Specified)  No partnership data on file   Family History  Problem Relation Age of Onset   Alzheimer's disease Mother    Hypertension Father    Cirrhosis Father    Diabetes Mellitus II Father    Diabetes Brother    Hypertension Brother    Pulmonary embolism Brother    Stroke Brother    Colon cancer Neg Hx    Rectal cancer Neg Hx    Pancreatic cancer Neg Hx    Stomach cancer Neg Hx    Esophageal cancer Neg Hx    Liver cancer Neg Hx    No Known Allergies    Review of Systems  Constitutional:  Negative for fever and malaise/fatigue.  HENT:  Negative for congestion, nosebleeds and sinus pain.   Eyes:  Negative for blurred vision.  Respiratory:  Negative for  shortness of breath.   Cardiovascular:  Negative for chest pain, palpitations and leg swelling.  Gastrointestinal:  Negative for abdominal pain, blood in stool and nausea.  Genitourinary:  Negative for dysuria and frequency.  Musculoskeletal:  Negative for falls.  Skin:  Negative for rash.  Neurological:  Negative for dizziness, loss of consciousness and headaches.  Endo/Heme/Allergies:  Negative for environmental allergies.  Psychiatric/Behavioral:  Negative for depression. The patient is not nervous/anxious.       Objective:     Pulse 72   Temp 98.1 F (36.7 C) (Oral)  Resp 16   Ht 5' 3.5" (1.613 m)   Wt 185 lb (83.9 kg)   LMP  (LMP Unknown)   SpO2 98%   BMI 32.26 kg/m  BP Readings from Last 3 Encounters:  12/07/23 124/84  08/31/23 120/88  06/08/23 128/86   Wt Readings from Last 3 Encounters:  02/29/24 185 lb (83.9 kg)  12/07/23 180 lb 12.8 oz (82 kg)  08/31/23 177 lb (80.3 kg)   SpO2 Readings from Last 3 Encounters:  02/29/24 98%  12/07/23 98%  08/31/23 98%      Physical Exam Vitals and nursing note reviewed.  Constitutional:      General: She is not in acute distress.    Appearance: Normal appearance. She is well-developed.  HENT:     Head: Normocephalic and atraumatic.  Eyes:     General: No scleral icterus.       Right eye: No discharge.        Left eye: No discharge.  Cardiovascular:     Rate and Rhythm: Normal rate and regular rhythm.     Heart sounds: No murmur heard. Pulmonary:     Effort: Pulmonary effort is normal. No respiratory distress.     Breath sounds: Normal breath sounds.  Musculoskeletal:        General: Normal range of motion.     Cervical back: Normal range of motion and neck supple.     Right lower leg: No edema.     Left lower leg: No edema.  Skin:    General: Skin is warm and dry.     Findings: Erythema and rash present. Rash is macular and papular.     Comments: Rash is from L side neck to right side anteriorly   Neurological:     Mental Status: She is alert and oriented to person, place, and time.  Psychiatric:        Mood and Affect: Mood normal.        Behavior: Behavior normal.        Thought Content: Thought content normal.        Judgment: Judgment normal.      No results found for any visits on 02/29/24.  Last CBC Lab Results  Component Value Date   WBC 8.2 08/31/2023   HGB 12.6 08/31/2023   HCT 38.6 08/31/2023   MCV 95.8 08/31/2023   MCH 31.2 03/02/2023   RDW 13.6 08/31/2023   PLT 301.0 08/31/2023   Last metabolic panel Lab Results  Component Value Date   GLUCOSE 193 (H) 08/31/2023   NA 138 08/31/2023   K 3.9 08/31/2023   CL 101 08/31/2023   CO2 29 08/31/2023   BUN 14 08/31/2023   CREATININE 0.93 08/31/2023   GFR 63.93 08/31/2023   CALCIUM 9.9 08/31/2023   PROT 7.0 08/31/2023   ALBUMIN 4.0 08/31/2023   BILITOT 0.5 08/31/2023   ALKPHOS 71 08/31/2023   AST 11 08/31/2023   ALT 16 08/31/2023   ANIONGAP 4 (L) 03/02/2023   Last lipids Lab Results  Component Value Date   CHOL 186 08/31/2023   HDL 31.20 (L) 08/31/2023   LDLCALC 107 (H) 08/31/2023   LDLDIRECT 84.0 05/05/2022   TRIG 241.0 (H) 08/31/2023   CHOLHDL 6 08/31/2023   Last hemoglobin A1c Lab Results  Component Value Date   HGBA1C 7.5 (A) 12/07/2023   Last thyroid  functions Lab Results  Component Value Date   TSH 1.31 08/31/2023   Last vitamin D  Lab Results  Component Value Date  VD25OH 29.56 (L) 02/17/2023   Last vitamin B12 and Folate Lab Results  Component Value Date   VITAMINB12 >1500 (H) 02/17/2023      The 10-year ASCVD risk score (Arnett DK, et al., 2019) is: 37.6%    Assessment & Plan:   Problem List Items Addressed This Visit       Unprioritized   Hypertension - Primary   Hyperlipidemia associated with type 2 diabetes mellitus (HCC)   Relevant Orders   Comprehensive metabolic panel with GFR   Other Visit Diagnoses       Type 2 diabetes mellitus with other specified  complication, without long-term current use of insulin  (HCC)         Rash       Relevant Medications   predniSONE (DELTASONE) 10 MG tablet   methylPREDNISolone acetate (DEPO-MEDROL) injection 40 mg (Completed)   Other Relevant Orders   Comprehensive metabolic panel with GFR   CBC with Differential/Platelet     Assessment and Plan Assessment & Plan Diabetes mellitus with hyperglycemia   She experiences fluctuating blood glucose levels, reaching up to 350 mg/dL, despite taking insulin , Farxiga , and Mounjaro. Her diet is low in carbohydrates, yet significant glucose variability persists. A1c has decreased, showing some control over long-term glucose levels. There are concerns about potential hyperglycemia exacerbation with steroid use for dermatitis treatment. A sliding scale insulin  regimen is discussed to manage hyperglycemia. Administer a steroid injection for dermatitis treatment and prescribe a prescription strength topical cream. She should monitor blood glucose four times daily, including fasting and postprandial checks, and follow the sliding scale insulin  regimen detailed in MyChart. Contact the healthcare provider if blood glucose exceeds 400 mg/dL or if the condition does not improve.  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 No new lotions, perfumes, or jewelry have been used, and she has sensitive skin with consistent skincare products. The differential diagnosis includes contact dermatitis or an allergic reaction, though no clear allergen is identified. Steroid treatment is considered despite potential impacts on blood glucose levels. Due to the severity of symptoms, a decision is made to proceed with a steroid injection and topical cream. She should monitor for improvement and report if the condition persists or worsens.    Return if symptoms worsen or fail to improve.    Ephram Kornegay R Lowne Chase, DO

## 2024-02-29 NOTE — Patient Instructions (Signed)
 Sliding scale 200-250     4u 251-300     8 u  301-350-----12u  351-400 ----16 u

## 2024-03-18 ENCOUNTER — Encounter: Payer: Self-pay | Admitting: Family Medicine

## 2024-03-18 ENCOUNTER — Ambulatory Visit: Admitting: Family Medicine

## 2024-03-18 VITALS — BP 160/90 | HR 91 | Temp 98.9°F | Resp 16 | Ht 63.5 in | Wt 186.4 lb

## 2024-03-18 DIAGNOSIS — L299 Pruritus, unspecified: Secondary | ICD-10-CM | POA: Diagnosis not present

## 2024-03-18 DIAGNOSIS — I10 Essential (primary) hypertension: Secondary | ICD-10-CM

## 2024-03-18 DIAGNOSIS — L309 Dermatitis, unspecified: Secondary | ICD-10-CM | POA: Diagnosis not present

## 2024-03-18 MED ORDER — CLOTRIMAZOLE-BETAMETHASONE 1-0.05 % EX CREA: 1.0000 | TOPICAL_CREAM | Freq: Every day | CUTANEOUS | 0 refills | Status: AC

## 2024-03-18 NOTE — Progress Notes (Signed)
 Established Patient Office Visit  Subjective   Patient ID: Jordan Joseph, female    DOB: 1955-11-16  Age: 68 y.o. MRN: [id number]  Chief Complaint  Patient presents with   Rash    Pt states taking prednisone  and states the rash got better slightly but states still having itching and states using Benadryl   Follow-up    HPI Discussed the use of AI scribe software for clinical note transcription with the patient, who gave verbal consent to proceed.  History of Present Illness Jordan Joseph is a 68 year old female with hypertension and diabetes who presents with persistent neck itching.  She experiences persistent itching localized to the front of her neck, extending from the earlobe to the area behind the earlobe. The itching initially improved with medication but returned when she reduced the dosage. 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  She has been taking Benadryl to manage the itching, which helps her sleep but leaves her feeling tired and sluggish in the mornings. She has also been using witch hazel, which helps dry the area but subsequently causes itching due to dryness.  No recent changes in personal care products, laundry detergents, or clothing that could explain the symptoms. She has not purchased new clothes in two years and has been keeping her hair up and avoiding necklaces to prevent irritation.  Her blood pressure was previously controlled but has been elevated recently. She is currently taking losartan  and another medication she refers to as 'smolactine'. Her diabetes is managed with a recent HbA1c of 7.5.   Patient Active Problem List   Diagnosis Date Noted   Long term (current) use of oral hypoglycemic drugs 06/08/2023   Long-term (current) use of injectable non-insulin  antidiabetic drugs 06/08/2023   Preventative health care 10/18/2021   Hyperlipidemia associated  with type 2 diabetes mellitus (HCC) 04/10/2020   Hypertension 02/17/2020   Uncontrolled type 2 diabetes mellitus with hyperglycemia (HCC) 02/17/2020   Equinus deformity of foot, acquired 05/01/2014   Past Medical History:  Diagnosis Date   Diabetes mellitus type II, uncontrolled    HTN (hypertension)    Past Surgical History:  Procedure Laterality Date   CHOLECYSTECTOMY     COLONOSCOPY  10/27/2013   Abe Abed. [name], MD. left sided diverticulosis. O/W normar screening colonoscopy.   Suburethral sling     For incontinence- 2000   Social History   Tobacco Use   Smoking status: Never   Smokeless tobacco: Never  Vaping Use   Vaping status: Never Used  Substance Use Topics   Alcohol use: Never   Drug use: Never   Social History   Socioeconomic History   Marital status: Divorced    Spouse name: Not on file   Number of children: 2   Years of education: Not on file   Highest education level: Bachelor's degree (e.g., BA, AB, BS)  Occupational History   Occupation: Engineer, site  Tobacco Use   Smoking status: Never   Smokeless tobacco: Never  Vaping Use   Vaping status: Never Used  Substance and Sexual Activity   Alcohol use: Never   Drug use: Never   Sexual activity: Not Currently  Other Topics Concern   Not on file  Social History Narrative   Exercise -- walking 5000 steps    15 min trampoline   15 min gazelle    Social Drivers of Corporate investment banker Strain: Low  Risk  (02/26/2024)   Overall Financial Resource Strain (CARDIA)    Difficulty of Paying Living Expenses: Not very hard  Food Insecurity: No Food Insecurity (02/26/2024)   Hunger Vital Sign    Worried About Running Out of Food in the Last Year: Never true    Ran Out of Food in the Last Year: Never true  Transportation Needs: No Transportation Needs (02/26/2024)   PRAPARE - Administrator, Civil Service (Medical): No    Lack of Transportation (Non-Medical): No  Physical Activity:  Insufficiently Active (02/26/2024)   Exercise Vital Sign    Days of Exercise per Week: 2 days    Minutes of Exercise per Session: 60 min  Stress: Stress Concern Present (02/26/2024)   [employer] of Occupational Health - Occupational Stress Questionnaire    Feeling of Stress : To some extent  Social Connections: Moderately Integrated (02/26/2024)   Social Connection and Isolation Panel    Frequency of Communication with Friends and Family: More than three times a week    Frequency of Social Gatherings with Friends and Family: Once a week    Attends Religious Services: More than 4 times per year    Active Member of [employer] or Organizations: Yes    Attends Engineer, structural: More than 4 times per year    Marital Status: Divorced  Intimate Partner Violence: Not At Risk (11/03/2022)   Humiliation, Afraid, Rape, and Kick questionnaire    Fear of Current or Ex-Partner: No    Emotionally Abused: No    Physically Abused: No    Sexually Abused: No   Family Status  Relation Name Status   Mother  Deceased at age 36   Father  Deceased at age 32   Brother  Alive   MGM  Deceased   MGF  Deceased at age 75   PGM  Deceased   PGF  Deceased   Neg Hx  (Not Specified)  No partnership data on file   Family History  Problem Relation Age of Onset   Alzheimer's disease Mother    Hypertension Father    Cirrhosis Father    Diabetes Mellitus II Father    Diabetes Brother    Hypertension Brother    Pulmonary embolism Brother    Stroke Brother    Colon cancer Neg Hx    Rectal cancer Neg Hx    Pancreatic cancer Neg Hx    Stomach cancer Neg Hx    Esophageal cancer Neg Hx    Liver cancer Neg Hx    No Known Allergies    Review of Systems  Constitutional:  Negative for fever.  HENT:  Negative for congestion.   Eyes:  Negative for blurred vision.  Respiratory:  Negative for cough.   Cardiovascular:  Negative for chest pain and palpitations.  Gastrointestinal:  Negative for  vomiting.  Musculoskeletal:  Negative for back pain.  Skin:  Positive for itching and rash.  Neurological:  Negative for loss of consciousness and headaches.      Objective:     BP (!) 160/90 (BP Location: Left Arm, Patient Position: Sitting, Cuff Size: Large)   Pulse 91   Temp 98.9 F (37.2 C) (Oral)   Resp 16   Ht 5' 3.5 (1.613 m)   Wt 186 lb 6.4 oz (84.6 kg)   LMP  (LMP Unknown)   SpO2 98%   BMI 32.50 kg/m  BP Readings from Last 3 Encounters:  03/18/24 (!) 160/90  12/07/23 124/84  08/31/23 120/88   Wt Readings from Last 3 Encounters:  03/18/24 186 lb 6.4 oz (84.6 kg)  02/29/24 185 lb (83.9 kg)  12/07/23 180 lb 12.8 oz (82 kg)   SpO2 Readings from Last 3 Encounters:  03/18/24 98%  02/29/24 98%  12/07/23 98%      Physical Exam Vitals and nursing note reviewed.  Constitutional:      General: She is not in acute distress.    Appearance: Normal appearance. She is well-developed.  HENT:     Head: Normocephalic and atraumatic.   Eyes:     General: No scleral icterus.       Right eye: No discharge.        Left eye: No discharge.    Cardiovascular:     Rate and Rhythm: Normal rate and regular rhythm.     Heart sounds: No murmur heard. Pulmonary:     Effort: Pulmonary effort is normal. No respiratory distress.     Breath sounds: Normal breath sounds.   Musculoskeletal:        General: Normal range of motion.     Cervical back: Normal range of motion and neck supple.     Right lower leg: No edema.     Left lower leg: No edema.   Skin:    General: Skin is warm and dry.     Findings: Rash present.         Comments: + mac/ pap rash ant neck   Neurological:     Mental Status: She is alert and oriented to person, place, and time.   Psychiatric:        Mood and Affect: Mood normal.        Behavior: Behavior normal.        Thought Content: Thought content normal.        Judgment: Judgment normal.     No results found for any visits on  03/18/24.  Last CBC Lab Results  Component Value Date   WBC 8.2 08/31/2023   HGB 12.6 08/31/2023   HCT 38.6 08/31/2023   MCV 95.8 08/31/2023   MCH 31.2 03/02/2023   RDW 13.6 08/31/2023   PLT 301.0 08/31/2023   Last metabolic panel Lab Results  Component Value Date   GLUCOSE 193 (H) 08/31/2023   NA 138 08/31/2023   K 3.9 08/31/2023   CL 101 08/31/2023   CO2 29 08/31/2023   BUN 14 08/31/2023   CREATININE 0.93 08/31/2023   GFR 63.93 08/31/2023   CALCIUM 9.9 08/31/2023   PROT 7.0 08/31/2023   ALBUMIN 4.0 08/31/2023   BILITOT 0.5 08/31/2023   ALKPHOS 71 08/31/2023   AST 11 08/31/2023   ALT 16 08/31/2023   ANIONGAP 4 (L) 03/02/2023   Last lipids Lab Results  Component Value Date   CHOL 186 08/31/2023   HDL 31.20 (L) 08/31/2023   LDLCALC 107 (H) 08/31/2023   LDLDIRECT 84.0 05/05/2022   TRIG 241.0 (H) 08/31/2023   CHOLHDL 6 08/31/2023   Last hemoglobin A1c Lab Results  Component Value Date   HGBA1C 7.5 (A) 12/07/2023   Last thyroid  functions Lab Results  Component Value Date   TSH 1.31 08/31/2023   Last vitamin D  Lab Results  Component Value Date   VD25OH 29.56 (L) 02/17/2023   Last vitamin B12 and Folate Lab Results  Component Value Date   VITAMINB12 >1500 (H) 02/17/2023      The 10-year ASCVD risk score (Arnett DK, et al., 2019) is: 34.5%  Assessment & Plan:   Problem List Items Addressed This Visit   None Visit Diagnoses       Dermatitis    -  Primary   Relevant Medications   clotrimazole-betamethasone (LOTRISONE) cream   Other Relevant Orders   Ambulatory referral to Dermatology     Severe pruritus       Relevant Orders   Ambulatory referral to Dermatology     Assessment and Plan Assessment & Plan Contact Dermatitis   She experiences localized itching and irritation on the neck from the earlobe to the front, 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 Symptoms improved with oral prednisone  but recurred after tapering. Persistent itching  continues despite Benadryl and witch hazel use, suggesting contact dermatitis from an irritant or allergen. Prescribe a topical steroid cream for localized application on the neck. Refer to dermatology for further evaluation and management. Continue Benadryl for symptomatic relief of itching.  Hypertension   Her blood pressure is elevated, previously well-controlled with losartan . The potential impact of Benadryl on blood pressure control is under evaluation. Monitor blood pressure regularly and continue losartan  as prescribed.  Diabetes Mellitus   Diabetes is managed with an HbA1c of 7.5%. Its potential contribution to the skin condition is being considered. Monitor blood glucose levels regularly and continue the current diabetes management plan.    No follow-ups on file.    Damarion Mendizabal R Lowne Chase, DO

## 2024-03-24 ENCOUNTER — Ambulatory Visit

## 2024-03-30 DIAGNOSIS — L2389 Allergic contact dermatitis due to other agents: Secondary | ICD-10-CM | POA: Diagnosis not present

## 2024-04-08 ENCOUNTER — Ambulatory Visit: Admitting: Internal Medicine

## 2024-04-11 ENCOUNTER — Encounter: Payer: Self-pay | Admitting: Internal Medicine

## 2024-04-11 ENCOUNTER — Ambulatory Visit: Admitting: Internal Medicine

## 2024-04-11 VITALS — BP 132/80 | HR 81 | Ht 63.5 in | Wt 184.4 lb

## 2024-04-11 DIAGNOSIS — Z7984 Long term (current) use of oral hypoglycemic drugs: Secondary | ICD-10-CM | POA: Diagnosis not present

## 2024-04-11 DIAGNOSIS — Z794 Long term (current) use of insulin: Secondary | ICD-10-CM

## 2024-04-11 DIAGNOSIS — Z7985 Long-term (current) use of injectable non-insulin antidiabetic drugs: Secondary | ICD-10-CM | POA: Diagnosis not present

## 2024-04-11 DIAGNOSIS — E1165 Type 2 diabetes mellitus with hyperglycemia: Secondary | ICD-10-CM | POA: Diagnosis not present

## 2024-04-11 LAB — POCT GLYCOSYLATED HEMOGLOBIN (HGB A1C): Hemoglobin A1C: 8.5 % — AB (ref 4.0–5.6)

## 2024-04-11 MED ORDER — INSULIN DEGLUDEC FLEXTOUCH 100 UNIT/ML ~~LOC~~ SOPN: 34.0000 [IU] | PEN_INJECTOR | Freq: Every day | SUBCUTANEOUS | 4 refills | Status: AC

## 2024-04-11 MED ORDER — TIRZEPATIDE 7.5 MG/0.5ML ~~LOC~~ SOAJ: 7.5000 mg | SUBCUTANEOUS | 3 refills | Status: DC

## 2024-04-11 MED ORDER — DAPAGLIFLOZIN PROPANEDIOL 10 MG PO TABS: 10.0000 mg | ORAL_TABLET | Freq: Every day | ORAL | 3 refills | Status: AC

## 2024-04-11 MED ORDER — INSULIN PEN NEEDLE 32G X 4 MM MISC: 1.0000 | Freq: Every day | 3 refills | Status: AC

## 2024-04-11 NOTE — Patient Instructions (Addendum)
-   Continue  Farxiga  10 mg, 1 tablet daily  - Increase Tresiba   34 units daily  - Increase Mounjaro  7.5 mg weekly    HOW TO TREAT LOW BLOOD SUGARS (Blood sugar LESS THAN 70 MG/DL) Please follow the RULE OF 15 for the treatment of hypoglycemia treatment (when your (blood sugars are less than 70 mg/dL)   STEP 1: Take 15 grams of carbohydrates when your blood sugar is low, which includes:  3-4 GLUCOSE TABS  OR 3-4 OZ OF JUICE OR REGULAR SODA OR ONE TUBE OF GLUCOSE GEL    STEP 2: RECHECK blood sugar in 15 MINUTES STEP 3: If your blood sugar is still low at the 15 minute recheck --> then, go back to STEP 1 and treat AGAIN with another 15 grams of carbohydrates.

## 2024-04-11 NOTE — Progress Notes (Signed)
 Name: Jordan Joseph  Age/ Sex: 68 y.o., female   MRN/ DOB: [id number], [birth year]     PCP: Antonio Cyndee Jamee JONELLE, DO   Reason for Endocrinology Evaluation: Type 2 Diabetes Mellitus  Initial Endocrine Consultative Visit: 03/06/2020    Jordan Joseph IDENTIFIER: [name] is a 68 y.o. female with a past medical history of HTN and T2DM. The Jordan Joseph has followed with Endocrinology clinic since 03/06/2020 for consultative assistance with management of Jordan Joseph diabetes.  DIABETIC HISTORY:  [name] was diagnosed with T2DM in 2013. Jordan Joseph hemoglobin A1c has ranged from 11.1% in 04/2020, peaking at 13.5 % in 2021  ON Jordan Joseph initial visit she was on basal insulin  and Glipizide. We stopped Glipizide, started Metformin  and increased basal insulin   She was last to follow-up for a year until Jordan Joseph return in 05/2022 with an A1c of 11.1%.  She was not taking metformin  due to skepticism about side effects so I take it off Jordan Joseph list, we continue Tresiba  and Farxiga   She met with our CDE for Dexcom G7 training 09/2022   Started Ozempic  01/2023  SUBJECTIVE:   During the last visit (12/07/2023): A1c 7.5%    Today (04/11/2024): [name] is here for a follow up on diabetes management.   She checks Jordan Joseph blood sugars multiple times daily through CGM, no hypoglycemia  She required prednisone  for ~ 3.5 weeks due to rash  Denies nausea , vomiting  Has noted transient loose stools after mounjaro  but no constipation     HOME DIABETES REGIMEN:  Farxiga  10 mg daily Mounjaro  5  mg weekly Tresiba  30  units daily       Statin: Yes ACE-I/ARB: Yes     CONTINUOUS GLUCOSE MONITORING RECORD INTERPRETATION    Dates of Recording:7/1-7/14/2025  Sensor description:dexcom  Results statistics:   CGM use % of time 69  Average and SD 210/43  Time in range 29 %  % Time Above 180 52  % Time above 250 19  % Time Below target 0   Glycemic patterns summary: BGs are mostly elevated overnight and worsened throughout the  day Hyperglycemic episodes  postprandial  Hypoglycemic episodes occurred n/a  Overnight periods: high    DIABETIC COMPLICATIONS: Microvascular complications:   Denies: CKD, retinopathy, neuropathy  Last Eye Exam: Completed 01/27/2024  Macrovascular complications:   Denies: CAD, CVA, PVD   HISTORY:  Past Medical History:  Past Medical History:  Diagnosis Date   Diabetes mellitus type II, uncontrolled    HTN (hypertension)    Past Surgical History:  Past Surgical History:  Procedure Laterality Date   CHOLECYSTECTOMY     COLONOSCOPY  10/27/2013   Jordan BIRCH. [name], MD. left sided diverticulosis. O/W normar screening colonoscopy.   Suburethral sling     For incontinence- 2000   Social History:  reports that she has never smoked. She has never used smokeless tobacco. She reports that she does not drink alcohol and does not use drugs. Family History:  Family History  Problem Relation Age of Onset   Alzheimer's disease Mother    Hypertension Father    Cirrhosis Father    Diabetes Mellitus II Father    Diabetes Brother    Hypertension Brother    Pulmonary embolism Brother    Stroke Brother    Colon cancer Neg Hx    Rectal cancer Neg Hx    Pancreatic cancer Neg Hx    Stomach cancer Neg Hx    Esophageal cancer Neg Hx    Liver  cancer Neg Hx      HOME MEDICATIONS: Allergies as of 04/11/2024   No Known Allergies      Medication List        Accurate as of April 11, 2024 11:59 AM. If you have any questions, ask your nurse or doctor.          STOP taking these medications    BD Pen Needle Micro U/F 32G X 6 MM Misc Generic drug: Insulin  Pen Needle Stopped by: Tamira Ryland J Arling Cerone       TAKE these medications    amLODipine  10 MG tablet Commonly known as: NORVASC  Take 1 tablet (10 mg total) by mouth daily.   carvedilol  25 MG tablet Commonly known as: COREG  Take 1 tablet (25 mg total) by mouth 2 (two) times daily with a meal.   cloNIDine  0.1 MG  tablet Commonly known as: CATAPRES  Take 1 tablet (0.1 mg total) by mouth 2 (two) times daily.   clotrimazole -betamethasone  cream Commonly known as: LOTRISONE  Apply 1 Application topically daily.   dapagliflozin  propanediol 10 MG Tabs tablet Commonly known as: Farxiga  Take 1 tablet (10 mg total) by mouth daily before breakfast.   Dexcom G7 Sensor Misc Change sensor every 10 days   Insulin  Degludec FlexTouch 100 UNIT/ML Sopn 30 Units by Other route daily in the afternoon.   losartan -hydrochlorothiazide 100-25 MG tablet Commonly known as: HYZAAR Take 1 tablet by mouth daily.   simvastatin  40 MG tablet Commonly known as: ZOCOR  Take 1 tablet (40 mg total) by mouth at bedtime.   tirzepatide  5 MG/0.5ML Pen Commonly known as: MOUNJARO  Inject 5 mg into the skin once a week.   Trospium  Chloride 60 MG Cp24 Take 1 capsule (60 mg total) by mouth daily.         OBJECTIVE:   Vital Signs: BP 132/80 (BP Location: Left Arm, Jordan Joseph Position: Sitting, Cuff Size: Normal)   Pulse 81   Ht 5' 3.5 (1.613 m)   Wt 184 lb 6.4 oz (83.6 kg)   LMP  (LMP Unknown)   SpO2 98%   BMI 32.15 kg/m   Wt Readings from Last 3 Encounters:  04/11/24 184 lb 6.4 oz (83.6 kg)  03/18/24 186 lb 6.4 oz (84.6 kg)  02/29/24 185 lb (83.9 kg)     Exam: General: Pt appears well and is in NAD  Lungs: Clear with good BS bilat   Heart: RRR  Extremities: No pretibial edema.   Neuro: MS is good with appropriate affect, pt is alert and Ox3    DM foot exam: 12/07/2023  The skin of the feet is intact without sores or ulcerations. The pedal pulses are 2+ on right and 2+ on left. The sensation is intact to a screening 5.07, 10 gram monofilament bilaterally    DATA REVIEWED:  Lab Results  Component Value Date   HGBA1C 8.5 (A) 04/11/2024   HGBA1C 7.5 (A) 12/07/2023   HGBA1C 7.8 (A) 06/08/2023     Latest Reference Range & Units 08/31/23 14:17  Sodium 135 - 145 mEq/L 138  Potassium 3.5 - 5.1 mEq/L 3.9   Chloride 96 - 112 mEq/L 101  CO2 19 - 32 mEq/L 29  Glucose 70 - 99 mg/dL 806 (H)  BUN 6 - 23 mg/dL 14  Creatinine 9.59 - 8.79 mg/dL 9.06  Calcium 8.4 - 89.4 mg/dL 9.9  Alkaline Phosphatase 39 - 117 U/L 71  Albumin 3.5 - 5.2 g/dL 4.0  AST 0 - 37 U/L 11  ALT 0 - 35  U/L 16  Total Protein 6.0 - 8.3 g/dL 7.0  Total Bilirubin 0.2 - 1.2 mg/dL 0.5  GFR >39.99 mL/min 63.93    Latest Reference Range & Units 08/31/23 14:17  Total CHOL/HDL Ratio  6  Cholesterol 0 - 200 mg/dL 813  HDL Cholesterol >60.99 mg/dL 68.79 (L)  LDL (calc) 0 - 99 mg/dL 892 (H)  MICROALB/CREAT RATIO 0.0 - 30.0 mg/g 65.9 (H)  NonHDL  155.15  Triglycerides 0.0 - 149.0 mg/dL 758.9 (H)  VLDL 0.0 - 59.9 mg/dL 51.7 (H)     ASSESSMENT / PLAN / RECOMMENDATIONS:   1) Type 2 Diabetes Mellitus, Poorly controlled, Without complications - Most recent A1c of 8.5 %. Goal A1c < 7.0 %.    -A1c has increased from 7.5% to 8.5%, Jordan Joseph has been on glucocorticoid therapy for skin rash -I have advised the Jordan Joseph to contact us  in the future should she go back on glucose records, and we may start Jordan Joseph on prandial insulin  temporarily while on steroids -She discontinued metformin  due to skepticism about side effects, and does not wish to go back on it -Will increase Mounjaro  as well as Tresiba  as below   MEDICATIONS: Increase Mounjaro  7.5 mg weekly Continue Farxiga  10 mg daily Increase Tresiba  34 units daily    EDUCATION / INSTRUCTIONS: BG monitoring instructions: Jordan Joseph is instructed to check Jordan Joseph blood sugars 1 times a day, fasting  Call [facility] if: BG persistently < 70  I reviewed the Rule of 15 for the treatment of hypoglycemia in detail with the Jordan Joseph. Literature supplied.   2) Diabetic complications:  Eye: Does not have known diabetic retinopathy.  Neuro/ Feet: Does not have known diabetic peripheral neuropathy .  Renal: Jordan Joseph does not have known baseline CKD. She   is  on an ACEI/ARB at  present.   F/U in 4 months     Signed electronically by: Stefano Redgie Butts, MD  [facility]  [facility] [address] [place] [place], [place] [address] Phone: [phone] FAX: [phone]   CC: Antonio Cyndee Jamee JONELLE ROSALEA [address] [place] [address] Phone: [phone]  Fax: [phone]  Return to Endocrinology clinic as below: No future appointments.

## 2024-04-13 ENCOUNTER — Other Ambulatory Visit (HOSPITAL_COMMUNITY): Payer: Self-pay

## 2024-05-17 DIAGNOSIS — E1165 Type 2 diabetes mellitus with hyperglycemia: Secondary | ICD-10-CM | POA: Diagnosis not present

## 2024-05-23 ENCOUNTER — Telehealth: Payer: Self-pay

## 2024-05-23 NOTE — Telephone Encounter (Signed)
 Patient was identified as falling into the [place] Measure - Diabetes.   Patient was: Appointment already scheduled for:  F/U with Endo on 08/12/24. Jordan Joseph

## 2024-06-06 ENCOUNTER — Telehealth: Payer: Self-pay | Admitting: Family Medicine

## 2024-06-06 NOTE — Telephone Encounter (Signed)
 Copied from CRM [id number]. Topic: Medicare AWV >> Jun 06, 2024  2:08 PM Nathanel DEL wrote: Reason for CRM: Called LVM 06/06/2024 to schedule AWV. Please schedule Virtual or Telehealth visits ONLY.   Nathanel Paschal; Care Guide Ambulatory Clinical Support [place] [facility] Direct Dial: [phone]

## 2024-07-13 NOTE — Telephone Encounter (Signed)
 Patient was identified as falling into the [place] Measure - Diabetes.   Patient was: Appointment already scheduled for:  F/U with Endo on 08/12/24. Jordan Joseph

## 2024-07-22 ENCOUNTER — Other Ambulatory Visit: Payer: Self-pay | Admitting: Family Medicine

## 2024-07-22 DIAGNOSIS — I10 Essential (primary) hypertension: Secondary | ICD-10-CM

## 2024-07-27 NOTE — Progress Notes (Addendum)
 Jordan Joseph                                          MRN: [id number]   07/27/2024   The [facility] Quality Team Specialist reviewed this patient medical record for the purposes of chart review for care gap closure. The following were reviewed: abstraction for care gap closure-diabetic eye exam. Chart reviewed for KED labs as well.  10/11/2024- cannot close ked 2025    [facility] Quality Team

## 2024-08-08 ENCOUNTER — Telehealth: Payer: Self-pay | Admitting: Pharmacist

## 2024-08-08 NOTE — Progress Notes (Signed)
 Pharmacy Quality Measure Review  Patient is appearing on [facility] list as not meeting the following Quality Measures - eye exam for patients with diabetes and Kidney health Evaluation for patients with Diabetes.   Reviewed her chart and patient has a documented eye exam form 01/27/2024 that has been scanned into her chart and has notation of no diabetic retinopathy in either eye.   She is seeing endocrinologist later this week 08/12/2024 - place note in appointment notes to check UACR and eGFR for Kidney Health Evaluation.   Will check with quality team about why eye exam might not be showing as completed.  Also noted that she has low adherence to simvastatin  and she is due to refill Farxiga . Has Rx for simvastatin  for only 30 day supply at [facility]. Tried to call to discuss. Left message on VM with CB# [phone] or [phone].    Madelin Ray, PharmD Clinical Pharmacist [facility]

## 2024-08-12 ENCOUNTER — Ambulatory Visit: Admitting: Internal Medicine

## 2024-08-12 ENCOUNTER — Encounter: Payer: Self-pay | Admitting: Internal Medicine

## 2024-08-12 VITALS — BP 152/90 | Ht 63.5 in | Wt 178.0 lb

## 2024-08-12 DIAGNOSIS — Z7985 Long-term (current) use of injectable non-insulin antidiabetic drugs: Secondary | ICD-10-CM | POA: Diagnosis not present

## 2024-08-12 DIAGNOSIS — E1165 Type 2 diabetes mellitus with hyperglycemia: Secondary | ICD-10-CM | POA: Diagnosis not present

## 2024-08-12 DIAGNOSIS — Z794 Long term (current) use of insulin: Secondary | ICD-10-CM | POA: Diagnosis not present

## 2024-08-12 DIAGNOSIS — Z7984 Long term (current) use of oral hypoglycemic drugs: Secondary | ICD-10-CM | POA: Diagnosis not present

## 2024-08-12 LAB — POCT GLYCOSYLATED HEMOGLOBIN (HGB A1C): Hemoglobin A1C: 7.4 % — AB (ref 4.0–5.6)

## 2024-08-12 MED ORDER — TIRZEPATIDE 10 MG/0.5ML ~~LOC~~ SOAJ: 10.0000 mg | SUBCUTANEOUS | 3 refills | Status: AC

## 2024-08-12 NOTE — Patient Instructions (Addendum)
-   Continue  Farxiga  10 mg, 1 tablet daily  - Continue Tresiba   32 units daily  - Increase Mounjaro  10 mg weekly    HOW TO TREAT LOW BLOOD SUGARS (Blood sugar LESS THAN 70 MG/DL) Please follow the RULE OF 15 for the treatment of hypoglycemia treatment (when your (blood sugars are less than 70 mg/dL)   STEP 1: Take 15 grams of carbohydrates when your blood sugar is low, which includes:  3-4 GLUCOSE TABS  OR 3-4 OZ OF JUICE OR REGULAR SODA OR ONE TUBE OF GLUCOSE GEL    STEP 2: RECHECK blood sugar in 15 MINUTES STEP 3: If your blood sugar is still low at the 15 minute recheck --> then, go back to STEP 1 and treat AGAIN with another 15 grams of carbohydrates.

## 2024-08-12 NOTE — Progress Notes (Signed)
 Name: Jordan Joseph  Age/ Sex: 68 y.o., female   MRN/ DOB: [id number], September 10, 1956     PCP: Antonio Cyndee Jamee JONELLE, DO   Reason for Endocrinology Evaluation: Type 2 Diabetes Mellitus  Initial Endocrine Consultative Visit: 03/06/2020    PATIENT IDENTIFIER: Jordan Joseph is a 68 y.o. female with a past medical history of HTN and T2DM. The patient has followed with Endocrinology clinic since 03/06/2020 for consultative assistance with management of her diabetes.  DIABETIC HISTORY:  Jordan Joseph was diagnosed with T2DM in 2013. [name] Her hemoglobin A1c has ranged from 11.1% in 04/2020, peaking at 13.5 % in 2021  ON her initial visit she was on basal insulin  and Glipizide. We stopped Glipizide, started Metformin  and increased basal insulin   She was last to follow-up for a year until her return in 05/2022 with an A1c of 11.1%.  She was not taking metformin  due to skepticism about side effects so I take it off her list, we continue Tresiba  and Farxiga   She met with our CDE for Dexcom G7 training 09/2022   Started Ozempic  01/2023  SUBJECTIVE:   During the last visit (04/11/2024): A1c 8.5%    Today (08/12/2024): Jordan Joseph is here for a follow up on diabetes management.   She checks her blood sugars multiple times daily through CGM, no hypoglycemia    No nausea or vomiting  No diarrhea but has occasional constipation    HOME DIABETES REGIMEN:  Farxiga  10 mg daily Mounjaro  7.5  mg weekly Tresiba  34 units daily - takes 32 units    Statin: Yes ACE-I/ARB: Yes     CONTINUOUS GLUCOSE MONITORING RECORD INTERPRETATION    Dates of Recording:10/31-11/14/2025  Sensor description:dexcom  Results statistics:   CGM use % of time 77  Average and SD 180/36  Time in range 47%  % Time Above 180 51  % Time above 250 2  % Time Below target 0   Glycemic patterns summary: BGs are optimal overnight and fluctuate during the day Hyperglycemic episodes  postprandial  Hypoglycemic episodes occurred  n/a  Overnight periods: Optimal    DIABETIC COMPLICATIONS: Microvascular complications:   Denies: CKD, retinopathy, neuropathy  Last Eye Exam: Completed 01/27/2024  Macrovascular complications:   Denies: CAD, CVA, PVD   HISTORY:  Past Medical History:  Past Medical History:  Diagnosis Date   Diabetes mellitus type II, uncontrolled    HTN (hypertension)    Past Surgical History:  Past Surgical History:  Procedure Laterality Date   CHOLECYSTECTOMY     COLONOSCOPY  10/27/2013   Ronal BIRCH. [name], MD. left sided diverticulosis. O/W normar screening colonoscopy.   Suburethral sling     For incontinence- 2000   Social History:  reports that she has never smoked. She has never used smokeless tobacco. She reports that she does not drink alcohol and does not use drugs. Family History:  Family History  Problem Relation Age of Onset   Alzheimer's disease Mother    Hypertension Father    Cirrhosis Father    Diabetes Mellitus II Father    Diabetes Brother    Hypertension Brother    Pulmonary embolism Brother    Stroke Brother    Colon cancer Neg Hx    Rectal cancer Neg Hx    Pancreatic cancer Neg Hx    Stomach cancer Neg Hx    Esophageal cancer Neg Hx    Liver cancer Neg Hx      HOME MEDICATIONS: Allergies as of 08/12/2024  No Known Allergies      Medication List        Accurate as of August 12, 2024 11:21 AM. If you have any questions, ask your nurse or doctor.          amLODipine  10 MG tablet Commonly known as: NORVASC  Take 1 tablet (10 mg total) by mouth daily.   carvedilol  25 MG tablet Commonly known as: COREG  Take 1 tablet (25 mg total) by mouth 2 (two) times daily with a meal.   cloNIDine  0.1 MG tablet Commonly known as: CATAPRES  Take 1 tablet (0.1 mg total) by mouth 2 (two) times daily.   clotrimazole -betamethasone  cream Commonly known as: LOTRISONE  Apply 1 Application topically daily.   dapagliflozin  propanediol 10 MG Tabs  tablet Commonly known as: Farxiga  Take 1 tablet (10 mg total) by mouth daily before breakfast.   Dexcom G7 Sensor Misc Change sensor every 10 days   Insulin  Degludec FlexTouch 100 UNIT/ML Sopn 34 Units by Other route daily in the afternoon.   Insulin  Pen Needle 32G X 4 MM Misc 1 Device by Does not apply route daily in the afternoon.   losartan -hydrochlorothiazide 100-25 MG tablet Commonly known as: HYZAAR Take 1 tablet by mouth daily.   simvastatin  40 MG tablet Commonly known as: ZOCOR  Take 1 tablet (40 mg total) by mouth at bedtime.   tirzepatide  7.5 MG/0.5ML Pen Commonly known as: MOUNJARO  Inject 7.5 mg into the skin once a week.   Trospium  Chloride 60 MG Cp24 Take 1 capsule (60 mg total) by mouth daily.         OBJECTIVE:   Vital Signs: BP (!) 152/90   Ht 5' 3.5 (1.613 m)   Wt 178 lb (80.7 kg)   LMP  (LMP Unknown)   BMI 31.04 kg/m   Wt Readings from Last 3 Encounters:  08/12/24 178 lb (80.7 kg)  04/11/24 184 lb 6.4 oz (83.6 kg)  03/18/24 186 lb 6.4 oz (84.6 kg)    Exam: General: Pt appears well and is in NAD  Lungs: Clear with good BS bilat   Heart: RRR  Neuro: MS is good with appropriate affect, pt is alert and Ox3    DM foot exam: 12/07/2023  The skin of the feet is intact without sores or ulcerations. The pedal pulses are 2+ on right and 2+ on left. The sensation is intact to a screening 5.07, 10 gram monofilament bilaterally    DATA REVIEWED:  Lab Results  Component Value Date   HGBA1C 7.4 (A) 08/12/2024   HGBA1C 8.5 (A) 04/11/2024   HGBA1C 7.5 (A) 12/07/2023     Latest Reference Range & Units 08/31/23 14:17  Sodium 135 - 145 mEq/L 138  Potassium 3.5 - 5.1 mEq/L 3.9  Chloride 96 - 112 mEq/L 101  CO2 19 - 32 mEq/L 29  Glucose 70 - 99 mg/dL 806 (H)  BUN 6 - 23 mg/dL 14  Creatinine 9.59 - 8.79 mg/dL 9.06  Calcium 8.4 - 89.4 mg/dL 9.9  Alkaline Phosphatase 39 - 117 U/L 71  Albumin 3.5 - 5.2 g/dL 4.0  AST 0 - 37 U/L 11  ALT 0 - 35  U/L 16  Total Protein 6.0 - 8.3 g/dL 7.0  Total Bilirubin 0.2 - 1.2 mg/dL 0.5  GFR >39.99 mL/min 63.93    Latest Reference Range & Units 08/31/23 14:17  Total CHOL/HDL Ratio  6  Cholesterol 0 - 200 mg/dL 813  HDL Cholesterol >60.99 mg/dL 68.79 (L)  LDL (calc) 0 - 99 mg/dL  107 (H)  MICROALB/CREAT RATIO 0.0 - 30.0 mg/g 65.9 (H)  NonHDL  155.15  Triglycerides 0.0 - 149.0 mg/dL 758.9 (H)  VLDL 0.0 - 59.9 mg/dL 51.7 (H)     ASSESSMENT / PLAN / RECOMMENDATIONS:   1) Type 2 Diabetes Mellitus, Sub-optimally controlled, Without complications - Most recent A1c of 7.4 %. Goal A1c < 7.0 %.    -A1c has trended down, but continues to be above goal -She is tolerating Mounjaro , will increase the dose as below, encouraged hydration to prevent constipation -She is taking less Tresiba  than previously prescribed, will not change   MEDICATIONS: Increase Mounjaro  10 mg weekly Continue Farxiga  10 mg daily Continue Tresiba  32 units daily   EDUCATION / INSTRUCTIONS: BG monitoring instructions: Patient is instructed to check her blood sugars 1 times a day, fasting  Call [facility] if: BG persistently < 70  I reviewed the Rule of 15 for the treatment of hypoglycemia in detail with the patient. Literature supplied.   2) Diabetic complications:  Eye: Does not have known diabetic retinopathy.  Neuro/ Feet: Does not have known diabetic peripheral neuropathy .  Renal: Patient does not have known baseline CKD. She   is  on an ACEI/ARB at present.   F/U in 4 months     Signed electronically by: Stefano Redgie Butts, MD  [facility]  [facility] [address] [place], [place] [address] Phone: [phone] FAX: [phone]   CC: Antonio Cyndee Jamee JONELLE ROSALEA [address] [place] [address] Phone: [phone]  Fax: [phone]  Return to Endocrinology clinic as below: Future Appointments  Date Time Provider  Department Center  08/12/2024 11:30 AM Hermine Feria, Donell Redgie, MD [id number] None

## 2024-08-30 DIAGNOSIS — E1165 Type 2 diabetes mellitus with hyperglycemia: Secondary | ICD-10-CM | POA: Diagnosis not present

## 2024-10-18 ENCOUNTER — Other Ambulatory Visit: Payer: Self-pay | Admitting: Family Medicine

## 2024-10-18 DIAGNOSIS — I10 Essential (primary) hypertension: Secondary | ICD-10-CM

## 2024-10-27 ENCOUNTER — Other Ambulatory Visit: Payer: Self-pay | Admitting: Family Medicine

## 2024-11-28 ENCOUNTER — Ambulatory Visit

## 2024-11-28 ENCOUNTER — Encounter: Admitting: Family Medicine

## 2025-02-08 ENCOUNTER — Ambulatory Visit: Admitting: Internal Medicine
